# Patient Record
Sex: Female | Born: 1945 | Race: Black or African American | Hispanic: No | Marital: Single | Smoking: Former smoker
Health system: Southern US, Community
[De-identification: ages and names within clinical notes are randomized; demographics above are authoritative.]

## PROBLEM LIST (undated history)

## (undated) DIAGNOSIS — E669 Obesity, unspecified: Secondary | ICD-10-CM

## (undated) DIAGNOSIS — I700 Atherosclerosis of aorta: Secondary | ICD-10-CM

## (undated) DIAGNOSIS — R42 Dizziness and giddiness: Secondary | ICD-10-CM

## (undated) DIAGNOSIS — J302 Other seasonal allergic rhinitis: Secondary | ICD-10-CM

## (undated) DIAGNOSIS — IMO0001 Reserved for inherently not codable concepts without codable children: Secondary | ICD-10-CM

## (undated) DIAGNOSIS — R52 Pain, unspecified: Secondary | ICD-10-CM

## (undated) DIAGNOSIS — I10 Essential (primary) hypertension: Secondary | ICD-10-CM

## (undated) DIAGNOSIS — J189 Pneumonia, unspecified organism: Secondary | ICD-10-CM

## (undated) DIAGNOSIS — R768 Other specified abnormal immunological findings in serum: Secondary | ICD-10-CM

## (undated) DIAGNOSIS — E7800 Pure hypercholesterolemia, unspecified: Secondary | ICD-10-CM

## (undated) DIAGNOSIS — G4730 Sleep apnea, unspecified: Secondary | ICD-10-CM

## (undated) DIAGNOSIS — J40 Bronchitis, not specified as acute or chronic: Secondary | ICD-10-CM

## (undated) DIAGNOSIS — I209 Angina pectoris, unspecified: Secondary | ICD-10-CM

## (undated) DIAGNOSIS — J3081 Allergic rhinitis due to animal (cat) (dog) hair and dander: Secondary | ICD-10-CM

## (undated) DIAGNOSIS — M1990 Unspecified osteoarthritis, unspecified site: Secondary | ICD-10-CM

## (undated) HISTORY — PX: CARPAL TUNNEL RELEASE: SHX101

## (undated) HISTORY — PX: ABDOMINAL HYSTERECTOMY: SHX81

## (undated) HISTORY — PX: EYE SURGERY: SHX253

---

## 2014-04-08 ENCOUNTER — Ambulatory Visit: Payer: Self-pay | Admitting: Family Medicine

## 2015-01-21 ENCOUNTER — Emergency Department
Admission: EM | Admit: 2015-01-21 | Discharge: 2015-01-21 | Disposition: A | Discharge status: Home / Self Care | Payer: Medicare Other | Attending: Emergency Medicine | Admitting: Emergency Medicine

## 2015-01-21 ENCOUNTER — Encounter: Payer: Self-pay | Admitting: Emergency Medicine

## 2015-01-21 DIAGNOSIS — M2550 Pain in unspecified joint: Secondary | ICD-10-CM | POA: Insufficient documentation

## 2015-01-21 DIAGNOSIS — I10 Essential (primary) hypertension: Secondary | ICD-10-CM | POA: Insufficient documentation

## 2015-01-21 DIAGNOSIS — R7989 Other specified abnormal findings of blood chemistry: Secondary | ICD-10-CM | POA: Insufficient documentation

## 2015-01-21 DIAGNOSIS — M1990 Unspecified osteoarthritis, unspecified site: Secondary | ICD-10-CM | POA: Diagnosis not present

## 2015-01-21 DIAGNOSIS — Z87891 Personal history of nicotine dependence: Secondary | ICD-10-CM | POA: Insufficient documentation

## 2015-01-21 DIAGNOSIS — R748 Abnormal levels of other serum enzymes: Secondary | ICD-10-CM

## 2015-01-21 HISTORY — DX: Essential (primary) hypertension: I10

## 2015-01-21 HISTORY — DX: Allergic rhinitis due to animal (cat) (dog) hair and dander: J30.81

## 2015-01-21 HISTORY — DX: Unspecified osteoarthritis, unspecified site: M19.90

## 2015-01-21 LAB — CBC WITH DIFFERENTIAL/PLATELET
BASOS ABS: 0 10*3/uL (ref 0–0.1)
BASOS PCT: 1 %
EOS PCT: 2 %
Eosinophils Absolute: 0.1 10*3/uL (ref 0–0.7)
HEMATOCRIT: 37.8 % (ref 35.0–47.0)
Hemoglobin: 12.4 g/dL (ref 12.0–16.0)
LYMPHS PCT: 37 %
Lymphs Abs: 2.1 10*3/uL (ref 1.0–3.6)
MCH: 24.9 pg — ABNORMAL LOW (ref 26.0–34.0)
MCHC: 32.8 g/dL (ref 32.0–36.0)
MCV: 76.1 fL — AB (ref 80.0–100.0)
MONO ABS: 0.3 10*3/uL (ref 0.2–0.9)
MONOS PCT: 5 %
NEUTROS ABS: 3.1 10*3/uL (ref 1.4–6.5)
Neutrophils Relative %: 55 %
PLATELETS: 327 10*3/uL (ref 150–440)
RBC: 4.96 MIL/uL (ref 3.80–5.20)
RDW: 15.8 % — AB (ref 11.5–14.5)
WBC: 5.6 10*3/uL (ref 3.6–11.0)

## 2015-01-21 LAB — BASIC METABOLIC PANEL
Anion gap: 6 (ref 5–15)
BUN: 15 mg/dL (ref 6–20)
CALCIUM: 9.2 mg/dL (ref 8.9–10.3)
CO2: 29 mmol/L (ref 22–32)
Chloride: 105 mmol/L (ref 101–111)
Creatinine, Ser: 0.84 mg/dL (ref 0.44–1.00)
GFR calc Af Amer: >60 mL/min (ref >60)
GLUCOSE: 92 mg/dL (ref 65–99)
Potassium: 3.7 mmol/L (ref 3.5–5.1)
Sodium: 140 mmol/L (ref 135–145)

## 2015-01-21 LAB — CK: CK TOTAL: 349 U/L — AB (ref 38–234)

## 2015-01-21 NOTE — ED Provider Notes (Addendum)
[facility] Emergency Department Provider Note  ____________________________________________   I have reviewed the triage vital signs and the nursing notes.   HISTORY  Chief Complaint Joint Pain    HPI Anne Sharp is a 69 y.o. female patient with a history of arthritis states she had some joint pain after taking some supplements 6 weeks ago. She is not having any pain today but she read on the Internet that she might have rhabdomyolysis and she would like to be checked out. Patient states it was all the joints in her entire body. She denies any fever or chills or nausea or vomiting chest pain shortness of breath abdominal pain decreased exercise and she states that since she stopped taking some supplements, the name of which she cannot recall, the symptoms of a gradually improving her last several weeks. She states she read some things on the Internet which caused her to be concerned and she would like some blood work checked.  Past Medical History  Diagnosis Date  . Arthritis   . Hypertension   . Cat allergies     There are no active problems to display for this patient.   Past Surgical History  Procedure Laterality Date  . Abdominal hysterectomy      No current outpatient prescriptions on file.  Allergies Review of patient's allergies indicates no known allergies.  No family history on file.  Social History Social History  Substance Use Topics  . Smoking status: Former Games developer  . Smokeless tobacco: None  . Alcohol Use: No    Review of Systems Constitutional: No fever/chills Eyes: No visual changes. ENT: No sore throat. No stiff neck no neck pain Cardiovascular: Denies chest pain. Respiratory: Denies shortness of breath. Gastrointestinal:   no vomiting.  No diarrhea.  No constipation. Genitourinary: Negative for dysuria. Musculoskeletal: Negative lower extremity swelling Skin: Negative for rash. Neurological: Negative for headaches,  focal weakness or numbness. 10-point ROS otherwise negative.  ____________________________________________   PHYSICAL EXAM:  VITAL SIGNS: ED Triage Vitals  Enc Vitals Group     BP 01/21/15 1538 169/84 mmHg     Pulse Rate 01/21/15 1538 88     Resp 01/21/15 1538 18     Temp 01/21/15 1538 98.1 F (36.7 C)     Temp Source 01/21/15 1538 Oral     SpO2 01/21/15 1538 99 %     Weight 01/21/15 1538 220 lb (99.791 kg)     Height 01/21/15 1538  (1.626 m)     Head Cir --      Peak Flow --      Pain Score 01/21/15 1538 4     Pain Loc --      Pain Edu? --      Excl. in GC? --     Constitutional: Alert and oriented. Well appearing and in no acute distress. Eyes: Conjunctivae are normal. PERRL. EOMI. Head: Atraumatic. Nose: No congestion/rhinnorhea. Mouth/Throat: Mucous membranes are moist.  Oropharynx non-erythematous. Neck: No stridor.   Nontender with no meningismus Cardiovascular: Normal rate, regular rhythm. Grossly normal heart sounds.  Good peripheral circulation. Respiratory: Normal respiratory effort.  No retractions. Lungs CTAB. Abdominal: Soft and nontender. No distention. No guarding no rebound Back:  There is no focal tenderness or step off there is no midline tenderness there are no lesions noted. there is no CVA tenderness Musculoskeletal: No lower extremity tenderness. No joint effusions, no DVT signs strong distal pulses no edema Neurologic:  Normal speech and language. No gross  focal neurologic deficits are appreciated.  Skin:  Skin is warm, dry and intact. No rash noted. Psychiatric: Mood and affect are normal. Speech and behavior are normal.  ____________________________________________   LABS (all labs ordered are listed, but only abnormal results are displayed)  Labs Reviewed  CBC WITH DIFFERENTIAL/PLATELET - Abnormal; Notable for the following:    MCV 76.1 (*)    MCH 24.9 (*)    RDW 15.8 (*)    All other components within normal limits  CK  BASIC  METABOLIC PANEL   ____________________________________________  EKG  I personally interpreted any EKGs ordered by me or triage  ____________________________________________  RADIOLOGY  I reviewed any imaging ordered by me or triage that were performed during my shift ____________________________________________   PROCEDURES  Procedure(s) performed: None  Critical Care performed: None  ____________________________________________   INITIAL IMPRESSION / ASSESSMENT AND PLAN / ED COURSE  Pertinent labs & imaging results that were available during my care of the patient were reviewed by me and considered in my medical decision making (see chart for details).  Very well-appearing woman who has had a diffuse arthralgias which is now improving. She did go on a cruise. She may had then year some other infection although that is not likely given that there was no fever or other systemic illness. Her symptoms are gone at this time she has no complaints of pain.   ----------------------------------------- 7:54 PM on 01/21/2015 -----------------------------------------  Patient's total CK is very slightly elevated is unclear why this is. She has no evidence of compartment syndrome or any other significant pathology at this time spent 6 weeks of symptoms, patient's renal function is completely normal. We will refer her to her primary care doctor as an outpatient for further evaluation. Also refer her to rheumatology  ----------------------------------------- 8:13 PM on 01/21/2015 -----------------------------------------  The patient was very anxious after being told that her total CK was slightly elevated. I have tried to allay her fears but stressed follow-up at the same time. Patient is eager to go home to take care of her cat. She has no complaints or symptoms at this time. Her blood pressure is mildly elevated. I have indicated that she must go home and follow up closely with her  doctor for a recheck and she understands. She has no chest pain or headache or shortness of breath. Patient's systolic is coming down and I suspect this is an artifactual reading but the patient does not wish to stay for further blood pressure checks because she is ready to go. ______________________   FINAL CLINICAL IMPRESSION(S) / ED DIAGNOSES  Final diagnoses:  None     Jeanmarie PlantJames A Jaelynn Currier, MD 01/21/15 1936  Jeanmarie PlantJames A Tabby Beaston, MD 01/21/15 1955  Jeanmarie PlantJames A Rigo Letts, MD 01/21/15 2014

## 2015-01-21 NOTE — Discharge Instructions (Signed)
Drink plenty of fluids, at this time there is no significant evidence of muscle breakdown although your CK is very slightly elevated. This finding is of uncertain importance. If you have increased pain, fever, chills, muscle pain that gets worse, or you feel worse in any way return to the emergency room. Follow closely with your primary care doctor in a few days as well as rheumatology.

## 2015-01-21 NOTE — ED Notes (Addendum)
Pt states she started taking supplements again about 2 weeks ago and the muscle burning pain returned severely.  States she needs to use pain meds at night for relief.

## 2015-01-21 NOTE — ED Notes (Signed)
Pt presents with all over joint pain and burning for six weeks. Pt did start on a diet supplement about six weeks ago then started having sx. She then stopped supplements for two weeks and sx decreased but are still present and has had to take pain medicine to get relief. Pt with hx of arthritis.

## 2015-01-21 NOTE — ED Notes (Signed)

## 2015-05-27 ENCOUNTER — Encounter: Payer: Self-pay | Admitting: *Deleted

## 2015-05-30 NOTE — H&P (Signed)
See scanned note.

## 2015-06-02 ENCOUNTER — Encounter
Admission: RE | Disposition: A | Discharge status: Home / Self Care | Payer: Self-pay | Source: Ambulatory Visit | Attending: Ophthalmology

## 2015-06-02 ENCOUNTER — Ambulatory Visit: Payer: Medicare Other | Admitting: Certified Registered Nurse Anesthetist

## 2015-06-02 ENCOUNTER — Encounter: Payer: Self-pay | Admitting: *Deleted

## 2015-06-02 ENCOUNTER — Ambulatory Visit
Admission: RE | Admit: 2015-06-02 | Discharge: 2015-06-02 | Disposition: A | Discharge status: Home / Self Care | Payer: Medicare Other | Source: Ambulatory Visit | Attending: Ophthalmology | Admitting: Ophthalmology

## 2015-06-02 DIAGNOSIS — M1990 Unspecified osteoarthritis, unspecified site: Secondary | ICD-10-CM | POA: Insufficient documentation

## 2015-06-02 DIAGNOSIS — E669 Obesity, unspecified: Secondary | ICD-10-CM | POA: Insufficient documentation

## 2015-06-02 DIAGNOSIS — H269 Unspecified cataract: Secondary | ICD-10-CM | POA: Diagnosis not present

## 2015-06-02 DIAGNOSIS — H2512 Age-related nuclear cataract, left eye: Secondary | ICD-10-CM | POA: Insufficient documentation

## 2015-06-02 DIAGNOSIS — I10 Essential (primary) hypertension: Secondary | ICD-10-CM | POA: Insufficient documentation

## 2015-06-02 DIAGNOSIS — Z87891 Personal history of nicotine dependence: Secondary | ICD-10-CM | POA: Diagnosis not present

## 2015-06-02 HISTORY — DX: Dizziness and giddiness: R42

## 2015-06-02 HISTORY — DX: Bronchitis, not specified as acute or chronic: J40

## 2015-06-02 HISTORY — PX: CATARACT EXTRACTION W/PHACO: SHX586

## 2015-06-02 HISTORY — DX: Pneumonia, unspecified organism: J18.9

## 2015-06-02 HISTORY — DX: Reserved for inherently not codable concepts without codable children: IMO0001

## 2015-06-02 HISTORY — DX: Angina pectoris, unspecified: I20.9

## 2015-06-02 SURGERY — PHACOEMULSIFICATION, CATARACT, WITH IOL INSERTION
Anesthesia: Monitor Anesthesia Care | Site: Eye | Laterality: Left | Wound class: Clean

## 2015-06-02 MED ORDER — BSS IO SOLN
INTRAOCULAR | Status: DC | PRN
  Administered 2015-06-02: 1 mL via OPHTHALMIC

## 2015-06-02 MED ORDER — SODIUM CHLORIDE 0.9 % IV SOLN
INTRAVENOUS | Status: DC
  Administered 2015-06-02: 08:00:00 via INTRAVENOUS

## 2015-06-02 MED ORDER — MOXIFLOXACIN HCL 0.5 % OP SOLN
1.0000 [drp] | OPHTHALMIC | Status: AC
  Administered 2015-06-02 (×3): 1 [drp] via OPHTHALMIC

## 2015-06-02 MED ORDER — MIDAZOLAM HCL 2 MG/2ML IJ SOLN
INTRAMUSCULAR | Status: DC | PRN
  Administered 2015-06-02 (×3): 1 mg via INTRAVENOUS

## 2015-06-02 MED ORDER — LIDOCAINE HCL (PF) 4 % IJ SOLN
INTRAMUSCULAR | Status: DC | PRN
  Administered 2015-06-02: 4 mL via OPHTHALMIC

## 2015-06-02 MED ORDER — POVIDONE-IODINE 5 % OP SOLN
OPHTHALMIC | Status: AC
  Filled 2015-06-02: qty 30

## 2015-06-02 MED ORDER — CEFUROXIME OPHTHALMIC INJECTION 1 MG/0.1 ML
INJECTION | OPHTHALMIC | Status: DC | PRN
  Administered 2015-06-02: .1 mL via INTRACAMERAL

## 2015-06-02 MED ORDER — MOXIFLOXACIN HCL 0.5 % OP SOLN
OPHTHALMIC | Status: DC | PRN
  Administered 2015-06-02: 1 [drp] via OPHTHALMIC

## 2015-06-02 MED ORDER — TETRACAINE HCL 0.5 % OP SOLN
OPHTHALMIC | Status: AC
  Filled 2015-06-02: qty 2

## 2015-06-02 MED ORDER — CEFUROXIME OPHTHALMIC INJECTION 1 MG/0.1 ML
INJECTION | OPHTHALMIC | Status: AC
  Filled 2015-06-02: qty 0.1

## 2015-06-02 MED ORDER — CYCLOPENTOLATE HCL 2 % OP SOLN
1.0000 [drp] | OPHTHALMIC | Status: AC
  Administered 2015-06-02 (×4): 1 [drp] via OPHTHALMIC

## 2015-06-02 MED ORDER — LIDOCAINE HCL (PF) 4 % IJ SOLN
INTRAOCULAR | Status: DC | PRN
  Administered 2015-06-02: .5 mL via OPHTHALMIC

## 2015-06-02 MED ORDER — BUPIVACAINE HCL (PF) 0.75 % IJ SOLN
INTRAMUSCULAR | Status: AC
  Filled 2015-06-02: qty 10

## 2015-06-02 MED ORDER — POVIDONE-IODINE 5 % OP SOLN
OPHTHALMIC | Status: DC | PRN
  Administered 2015-06-02: 1 via OPHTHALMIC

## 2015-06-02 MED ORDER — NA CHONDROIT SULF-NA HYALURON 40-17 MG/ML IO SOLN
INTRAOCULAR | Status: AC
  Filled 2015-06-02: qty 1

## 2015-06-02 MED ORDER — PHENYLEPHRINE HCL 10 % OP SOLN
1.0000 [drp] | OPHTHALMIC | Status: AC
  Administered 2015-06-02 (×4): 1 [drp] via OPHTHALMIC

## 2015-06-02 MED ORDER — CARBACHOL 0.01 % IO SOLN
INTRAOCULAR | Status: DC | PRN
  Administered 2015-06-02: .5 mL via INTRAOCULAR

## 2015-06-02 MED ORDER — TETRACAINE HCL 0.5 % OP SOLN
OPHTHALMIC | Status: DC | PRN
  Administered 2015-06-02: 1 [drp] via OPHTHALMIC

## 2015-06-02 MED ORDER — ALFENTANIL 500 MCG/ML IJ INJ
INJECTION | INTRAMUSCULAR | Status: DC | PRN
  Administered 2015-06-02: 100 ug via INTRAVENOUS
  Administered 2015-06-02: 300 ug via INTRAVENOUS
  Administered 2015-06-02 (×3): 200 ug via INTRAVENOUS

## 2015-06-02 MED ORDER — NA CHONDROIT SULF-NA HYALURON 40-17 MG/ML IO SOLN
INTRAOCULAR | Status: DC | PRN
  Administered 2015-06-02: 1 mL via INTRAOCULAR

## 2015-06-02 MED ORDER — EPINEPHRINE HCL 1 MG/ML IJ SOLN
INTRAMUSCULAR | Status: AC
  Filled 2015-06-02: qty 2

## 2015-06-02 MED ORDER — LIDOCAINE HCL (PF) 4 % IJ SOLN
INTRAMUSCULAR | Status: AC
  Filled 2015-06-02: qty 5

## 2015-06-02 MED ORDER — HYALURONIDASE HUMAN 150 UNIT/ML IJ SOLN
INTRAMUSCULAR | Status: AC
  Filled 2015-06-02: qty 1

## 2015-06-02 SURGICAL SUPPLY — 30 items
CANNULA ANT/CHMB 27GA (MISCELLANEOUS) ×3 IMPLANT
CORD BIP STRL DISP 12FT (MISCELLANEOUS) ×3 IMPLANT
CUP MEDICINE 2OZ PLAST GRAD ST (MISCELLANEOUS) ×3 IMPLANT
DRAPE XRAY CASSETTE 23X24 (DRAPES) ×3 IMPLANT
ERASER HMR WETFIELD 18G (MISCELLANEOUS) ×3 IMPLANT
GLOVE BIO SURGEON STRL SZ8 (GLOVE) ×3 IMPLANT
GLOVE SURG LX 6.5 MICRO (GLOVE) ×2
GLOVE SURG LX 8.0 MICRO (GLOVE) ×2
GLOVE SURG LX STRL 6.5 MICRO (GLOVE) ×1 IMPLANT
GLOVE SURG LX STRL 8.0 MICRO (GLOVE) ×1 IMPLANT
GOWN STRL REUS W/ TWL LRG LVL3 (GOWN DISPOSABLE) ×1 IMPLANT
GOWN STRL REUS W/ TWL XL LVL3 (GOWN DISPOSABLE) ×1 IMPLANT
GOWN STRL REUS W/TWL LRG LVL3 (GOWN DISPOSABLE) ×2
GOWN STRL REUS W/TWL XL LVL3 (GOWN DISPOSABLE) ×2
LENS IOL ACRSF IQ ULTRA 18.5 (Intraocular Lens) ×1 IMPLANT
LENS IOL ACRYSOF IQ 18.5 (Intraocular Lens) ×3 IMPLANT
PACK CATARACT (MISCELLANEOUS) ×3 IMPLANT
PACK CATARACT DINGLEDEIN LX (MISCELLANEOUS) ×3 IMPLANT
PACK EYE AFTER SURG (MISCELLANEOUS) ×3 IMPLANT
SHLD EYE VISITEC  UNIV (MISCELLANEOUS) ×3 IMPLANT
SOL BSS BAG (MISCELLANEOUS) ×3
SOL PREP PVP 2OZ (MISCELLANEOUS) ×3
SOLUTION BSS BAG (MISCELLANEOUS) ×1 IMPLANT
SOLUTION PREP PVP 2OZ (MISCELLANEOUS) ×1 IMPLANT
SUT SILK 5-0 (SUTURE) ×3 IMPLANT
SYR 3ML LL SCALE MARK (SYRINGE) ×3 IMPLANT
SYR 5ML LL (SYRINGE) ×3 IMPLANT
SYR TB 1ML 27GX1/2 LL (SYRINGE) ×3 IMPLANT
WATER STERILE IRR 1000ML POUR (IV SOLUTION) ×3 IMPLANT
WIPE NON LINTING 3.25X3.25 (MISCELLANEOUS) ×3 IMPLANT

## 2015-06-02 NOTE — Transfer of Care (Signed)
Immediate Anesthesia Transfer of Care Note  Patient: Anne Sharp  Procedure(s) Performed: Procedure(s) with comments: CATARACT EXTRACTION PHACO AND INTRAOCULAR LENS PLACEMENT (IOC) (Left) - US 01:21 AP% 22.9 CDE 32.25 fluid pack lot # [id number]  Patient Location: PACU  Anesthesia Type:MAC  Level of Consciousness: awake, alert  and oriented  Airway & Oxygen Therapy: Pt spontaneously breathing  Post-op Assessment: Report given to RN and Post -op Vital signs reviewed and stable  Post vital signs: Reviewed and stable  Last Vitals:  Filed Vitals:   05/27/15 1447 06/02/15 0715  BP: 157/98 127/83  Pulse: 97 84  Temp:  36.1 C  Resp:  18    Last Pain:  Filed Vitals:   06/02/15 0721  PainSc: 0-No pain         Complications: No apparent anesthesia complications

## 2015-06-02 NOTE — Anesthesia Procedure Notes (Signed)
Performed by: Ahleah Simko Pre-anesthesia Checklist: Patient identified, Emergency Drugs available, Suction available, Patient being monitored and Timeout performed Oxygen Delivery Method: Nasal cannula       

## 2015-06-02 NOTE — Discharge Instructions (Addendum)
See handout. Eye Surgery Discharge Instructions  Expect mild scratchy sensation or mild soreness. DO NOT RUB YOUR EYE!  The day of surgery:  Minimal physical activity, but bed rest is not required  No reading, computer work, or close hand work  No bending, lifting, or straining.  May watch TV  For 24 hours:  No driving, legal decisions, or alcoholic beverages  Safety precautions  Eat anything you prefer: It is better to start with liquids, then soup then solid foods.  _____ Eye patch should be worn until postoperative exam tomorrow.  ____ Solar shield eyeglasses should be worn for comfort in the sunlight/patch while sleeping  Resume all regular medications including aspirin or Coumadin if these were discontinued prior to surgery. You may shower, bathe, shave, or wash your hair. Tylenol may be taken for mild discomfort.  Call your doctor if you experience significant pain, nausea, or vomiting, fever > 101 or other signs of infection. [phone] or [phone] Specific instructions:  Follow-up Information    Follow up with Sallee LangeINGELDEIN,STEVEN, MD.   Specialty:  Ophthalmology   Why:  06-03-15 at 9:40   Contact information:   [address]   [place] [address] [phone]      Eye Surgery Discharge Instructions  Expect mild scratchy sensation or mild soreness. DO NOT RUB YOUR EYE!  The day of surgery:  Minimal physical activity, but bed rest is not required  No reading, computer work, or close hand work  No bending, lifting, or straining.  May watch TV  For 24 hours:  No driving, legal decisions, or alcoholic beverages  Safety precautions  Eat anything you prefer: It is better to start with liquids, then soup then solid foods.  _____ Eye patch should be worn until postoperative exam tomorrow.  ____ Solar shield eyeglasses should be worn for comfort in the sunlight/patch while sleeping  Resume all regular medications including aspirin or  Coumadin if these were discontinued prior to surgery. You may shower, bathe, shave, or wash your hair. Tylenol may be taken for mild discomfort.  Call your doctor if you experience significant pain, nausea, or vomiting, fever > 101 or other signs of infection. [phone] or [phone] Specific instructions:  Follow-up Information    Follow up with Sallee LangeINGELDEIN,STEVEN, MD.   Specialty:  Ophthalmology   Why:  06-03-15 at 9:40   Contact information:   [address]   [place] [address] [phone]

## 2015-06-02 NOTE — Interval H&P Note (Signed)
History and Physical Interval Note:  06/02/2015 7:25 AM  [name]  has presented today for surgery, with the diagnosis of CATARACT  The various methods of treatment have been discussed with the patient and family. After consideration of risks, benefits and other options for treatment, the patient has consented to  Procedure(s): CATARACT EXTRACTION PHACO AND INTRAOCULAR LENS PLACEMENT (IOC) (Left) as a surgical intervention .  The patient's history has been reviewed, patient examined, no change in status, stable for surgery.  I have reviewed the patient's chart and labs.  Questions were answered to the patient's satisfaction.     Susen Haskew

## 2015-06-02 NOTE — Op Note (Signed)
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 Item Serial No. Manufacturer Lot No. LRB No. Used  LENS IOL ACRYSOF IQ 18.5 - [id number] Intraocular Lens LENS IOL ACRYSOF IQ 18.5 [id number] ALCON   Left 1   Anesthesia: 2% Lidocaine and 4% Marcaine in a 50/50 mixture with 10 unites/ml of Hylenex given as a peribulbar Anesthesiologist: Anesthesiologist: Berdine AddisonMathai Thomas, MD CRNA: Malva Coganatherine Beane, CRNA; Ginger CarneStephanie Michelet, CRNA Complications: none Estimated Blood Loss: less than 1 ml  Description of procedure:  The patient was given anesthesia and sedation via intravenous access. The patient was then prepped and draped in the usual fashion. A 25-gauge needle was bent for initiating the capsulorhexis. A 5-0 silk suture was placed through the conjunctiva superior and inferiorly to serve as bridle sutures. Hemostasis was obtained at the superior limbus using an eraser cautery. A partial thickness groove was made at the anterior surgical limbus with a 64 Beaver blade and this was dissected anteriorly with an SYSCOlcon Crescent knife. The anterior chamber was entered at 10 o'clock with a 1.0 mm paracentesis knife and through the lamellar dissection with a 2.6 mm Alcon keratome. Epi-Shugarcaine 0.5 CC [9 cc BSS Plus (Alcon), 3 cc 4% preservative-free lidocaine (Hospira) and 4 cc 1:1000 preservative-free, bisulfite-free epinephrine] was injected into the anterior chamber via the paracentesis tract. Epi-Shugarcaine 0.5 CC [9 cc BSS Plus (Alcon), 3 cc 4% preservative-free lidocaine (Hospira) and 4 cc 1:1000 preservative-free, bisulfite-free epinephrine] was injected into the anterior chamber via the paracentesis tract. DiscoVisc  was injected to replace the aqueous and a continuous tear curvilinear capsulorhexis was performed using a bent 25-gauge needle.  Balance salt on a syringe was used to perform hydro-dissection and phacoemulsification was carried out using a divide and conquer technique. Procedure(s) with comments: CATARACT EXTRACTION PHACO AND INTRAOCULAR LENS PLACEMENT (IOC) (Left) - US 01:21 AP% 22.9 CDE 32.25 fluid pack lot # [id number]. Irrigation/aspiration was used to remove the residual cortex and the capsular bag was inflated with DiscoVisc. The intraocular lens was inserted into the capsular bag using a pre-loaded UltraSert Delivery System. Irrigation/aspiration was used to remove the residual DiscoVisc. The wound was inflated with balanced salt and checked for leaks. None were found. Miostat was injected via the paracentesis track and 0.1 ml of cefuroxime containing 1 mg of drug  was injected via the paracentesis track. The wound was checked for leaks again and none were found.   The bridal sutures were removed and two drops of Vigamox were placed on the eye. An eye shield was placed to protect the eye and the patient was discharged to the recovery area in good condition.   Lashay Osborne MD

## 2015-06-02 NOTE — Anesthesia Postprocedure Evaluation (Signed)
Anesthesia Post Note  Patient: Anne Sharp  Procedure(s) Performed: Procedure(s) (LRB): CATARACT EXTRACTION PHACO AND INTRAOCULAR LENS PLACEMENT (IOC) (Left)  Patient location during evaluation: PACU Anesthesia Type: MAC Level of consciousness: awake and alert and oriented Pain management: satisfactory to patient Vital Signs Assessment: post-procedure vital signs reviewed and stable Respiratory status: respiratory function stable Cardiovascular status: stable    Last Vitals:  Filed Vitals:   05/27/15 1447 06/02/15 0715  BP: 157/98 127/83  Pulse: 97 84  Temp:  36.1 C  Resp:  18    Last Pain:  Filed Vitals:   06/02/15 0721  PainSc: 0-No pain                 Clydene PughBeane, Briauna Gilmartin D

## 2015-06-02 NOTE — Anesthesia Preprocedure Evaluation (Addendum)
Anesthesia Evaluation  Patient identified by MRN, date of birth, ID band Patient awake    Reviewed: Allergy & Precautions, NPO status , Patient's Chart, lab work & pertinent test results, reviewed documented beta blocker date and time   Airway Mallampati: III  TM Distance: >3 FB     Dental  (+) Chipped   Pulmonary shortness of breath, pneumonia, resolved, former smoker,           Cardiovascular hypertension, Pt. on medications + angina      Neuro/Psych    GI/Hepatic   Endo/Other    Renal/GU      Musculoskeletal  (+) Arthritis ,   Abdominal   Peds  Hematology   Anesthesia Other Findings Obese.EKG and ECHO OK. Temporary tooth.  Reproductive/Obstetrics                            Anesthesia Physical Anesthesia Plan  ASA: III  Anesthesia Plan: MAC   Post-op Pain Management:    Induction:   Airway Management Planned:   Additional Equipment:   Intra-op Plan:   Post-operative Plan:   Informed Consent: I have reviewed the patients History and Physical, chart, labs and discussed the procedure including the risks, benefits and alternatives for the proposed anesthesia with the patient or authorized representative who has indicated his/her understanding and acceptance.     Plan Discussed with: CRNA  Anesthesia Plan Comments:         Anesthesia Quick Evaluation

## 2015-07-24 ENCOUNTER — Encounter: Payer: Self-pay | Admitting: *Deleted

## 2015-07-29 NOTE — H&P (Signed)
See scanned note.

## 2015-07-30 ENCOUNTER — Encounter: Payer: Self-pay | Admitting: *Deleted

## 2015-07-30 ENCOUNTER — Ambulatory Visit: Payer: Medicare Other | Admitting: Anesthesiology

## 2015-07-30 ENCOUNTER — Encounter
Admission: RE | Disposition: A | Discharge status: Home / Self Care | Payer: Self-pay | Source: Ambulatory Visit | Attending: Ophthalmology

## 2015-07-30 ENCOUNTER — Ambulatory Visit
Admission: RE | Admit: 2015-07-30 | Discharge: 2015-07-30 | Disposition: A | Discharge status: Home / Self Care | Payer: Medicare Other | Source: Ambulatory Visit | Attending: Ophthalmology | Admitting: Ophthalmology

## 2015-07-30 DIAGNOSIS — Z87891 Personal history of nicotine dependence: Secondary | ICD-10-CM | POA: Insufficient documentation

## 2015-07-30 DIAGNOSIS — M1990 Unspecified osteoarthritis, unspecified site: Secondary | ICD-10-CM | POA: Diagnosis not present

## 2015-07-30 DIAGNOSIS — E669 Obesity, unspecified: Secondary | ICD-10-CM | POA: Insufficient documentation

## 2015-07-30 DIAGNOSIS — I10 Essential (primary) hypertension: Secondary | ICD-10-CM | POA: Diagnosis not present

## 2015-07-30 DIAGNOSIS — H2511 Age-related nuclear cataract, right eye: Secondary | ICD-10-CM | POA: Diagnosis not present

## 2015-07-30 DIAGNOSIS — H25011 Cortical age-related cataract, right eye: Secondary | ICD-10-CM | POA: Insufficient documentation

## 2015-07-30 HISTORY — DX: Pain, unspecified: R52

## 2015-07-30 HISTORY — PX: CATARACT EXTRACTION W/PHACO: SHX586

## 2015-07-30 HISTORY — DX: Other seasonal allergic rhinitis: J30.2

## 2015-07-30 SURGERY — PHACOEMULSIFICATION, CATARACT, WITH IOL INSERTION
Anesthesia: Monitor Anesthesia Care | Site: Eye | Laterality: Right | Wound class: Clean

## 2015-07-30 MED ORDER — CYCLOPENTOLATE HCL 2 % OP SOLN
OPHTHALMIC | Status: AC
  Administered 2015-07-30: 1 [drp] via OPHTHALMIC
  Filled 2015-07-30: qty 2

## 2015-07-30 MED ORDER — NA CHONDROIT SULF-NA HYALURON 40-17 MG/ML IO SOLN
INTRAOCULAR | Status: DC | PRN
  Administered 2015-07-30: 1 mL via INTRAOCULAR

## 2015-07-30 MED ORDER — EPINEPHRINE HCL 1 MG/ML IJ SOLN
INTRAOCULAR | Status: DC | PRN
  Administered 2015-07-30: 1 mL via OPHTHALMIC

## 2015-07-30 MED ORDER — CARBACHOL 0.01 % IO SOLN
INTRAOCULAR | Status: DC | PRN
  Administered 2015-07-30: 0.5 mL via INTRAOCULAR

## 2015-07-30 MED ORDER — BSS IO SOLN
INTRAOCULAR | Status: DC | PRN
  Administered 2015-07-30: .5 mL via OPHTHALMIC

## 2015-07-30 MED ORDER — EPINEPHRINE HCL 1 MG/ML IJ SOLN
INTRAMUSCULAR | Status: AC
  Filled 2015-07-30: qty 2

## 2015-07-30 MED ORDER — SODIUM CHLORIDE 0.9 % IV SOLN
INTRAVENOUS | Status: DC
  Administered 2015-07-30: 07:00:00 via INTRAVENOUS

## 2015-07-30 MED ORDER — LIDOCAINE HCL (PF) 4 % IJ SOLN
INTRAMUSCULAR | Status: AC
  Filled 2015-07-30: qty 5

## 2015-07-30 MED ORDER — TETRACAINE HCL 0.5 % OP SOLN
OPHTHALMIC | Status: AC
  Filled 2015-07-30: qty 2

## 2015-07-30 MED ORDER — CEFUROXIME OPHTHALMIC INJECTION 1 MG/0.1 ML
INJECTION | OPHTHALMIC | Status: DC | PRN
  Administered 2015-07-30: 0.1 mL via INTRACAMERAL

## 2015-07-30 MED ORDER — POVIDONE-IODINE 5 % OP SOLN
OPHTHALMIC | Status: AC
  Filled 2015-07-30: qty 30

## 2015-07-30 MED ORDER — PHENYLEPHRINE HCL 10 % OP SOLN
1.0000 [drp] | OPHTHALMIC | Status: AC | PRN
  Administered 2015-07-30 (×4): 1 [drp] via OPHTHALMIC

## 2015-07-30 MED ORDER — PHENYLEPHRINE HCL 10 % OP SOLN
OPHTHALMIC | Status: AC
  Administered 2015-07-30: 1 [drp] via OPHTHALMIC
  Filled 2015-07-30: qty 5

## 2015-07-30 MED ORDER — TETRACAINE HCL 0.5 % OP SOLN
OPHTHALMIC | Status: DC | PRN
  Administered 2015-07-30: 1 [drp] via OPHTHALMIC

## 2015-07-30 MED ORDER — LIDOCAINE HCL (PF) 4 % IJ SOLN
INTRAMUSCULAR | Status: DC | PRN
  Administered 2015-07-30: 4 mL via OPHTHALMIC

## 2015-07-30 MED ORDER — MOXIFLOXACIN HCL 0.5 % OP SOLN
OPHTHALMIC | Status: AC
  Administered 2015-07-30: 1 [drp] via OPHTHALMIC
  Filled 2015-07-30: qty 3

## 2015-07-30 MED ORDER — NA CHONDROIT SULF-NA HYALURON 40-17 MG/ML IO SOLN
INTRAOCULAR | Status: AC
  Filled 2015-07-30: qty 1

## 2015-07-30 MED ORDER — POVIDONE-IODINE 5 % OP SOLN
OPHTHALMIC | Status: DC | PRN
  Administered 2015-07-30: 1 via OPHTHALMIC

## 2015-07-30 MED ORDER — MOXIFLOXACIN HCL 0.5 % OP SOLN
1.0000 [drp] | OPHTHALMIC | Status: AC | PRN
  Administered 2015-07-30 (×3): 1 [drp] via OPHTHALMIC

## 2015-07-30 MED ORDER — CEFUROXIME OPHTHALMIC INJECTION 1 MG/0.1 ML
INJECTION | OPHTHALMIC | Status: AC
  Filled 2015-07-30: qty 0.1

## 2015-07-30 MED ORDER — HYALURONIDASE HUMAN 150 UNIT/ML IJ SOLN
INTRAMUSCULAR | Status: AC
  Filled 2015-07-30: qty 1

## 2015-07-30 MED ORDER — MOXIFLOXACIN HCL 0.5 % OP SOLN
OPHTHALMIC | Status: DC | PRN
  Administered 2015-07-30: 1 [drp] via OPHTHALMIC

## 2015-07-30 MED ORDER — MIDAZOLAM HCL 5 MG/5ML IJ SOLN
INTRAMUSCULAR | Status: DC | PRN
  Administered 2015-07-30 (×4): 1 mg via INTRAVENOUS

## 2015-07-30 MED ORDER — CYCLOPENTOLATE HCL 2 % OP SOLN
1.0000 [drp] | OPHTHALMIC | Status: AC | PRN
  Administered 2015-07-30 (×4): 1 [drp] via OPHTHALMIC

## 2015-07-30 MED ORDER — ALFENTANIL 500 MCG/ML IJ INJ
INJECTION | INTRAMUSCULAR | Status: DC | PRN
  Administered 2015-07-30 (×2): 500 ug via INTRAVENOUS

## 2015-07-30 MED ORDER — BUPIVACAINE HCL (PF) 0.75 % IJ SOLN
INTRAMUSCULAR | Status: AC
  Filled 2015-07-30: qty 10

## 2015-07-30 SURGICAL SUPPLY — 30 items
CANNULA ANT/CHMB 27GA (MISCELLANEOUS) ×3 IMPLANT
CORD BIP STRL DISP 12FT (MISCELLANEOUS) ×3 IMPLANT
CUP MEDICINE 2OZ PLAST GRAD ST (MISCELLANEOUS) ×3 IMPLANT
DRAPE XRAY CASSETTE 23X24 (DRAPES) ×3 IMPLANT
ERASER HMR WETFIELD 18G (MISCELLANEOUS) ×3 IMPLANT
GLOVE BIO SURGEON STRL SZ8 (GLOVE) ×3 IMPLANT
GLOVE SURG LX 6.5 MICRO (GLOVE) ×2
GLOVE SURG LX 8.0 MICRO (GLOVE) ×2
GLOVE SURG LX STRL 6.5 MICRO (GLOVE) ×1 IMPLANT
GLOVE SURG LX STRL 8.0 MICRO (GLOVE) ×1 IMPLANT
GOWN STRL REUS W/ TWL LRG LVL3 (GOWN DISPOSABLE) ×1 IMPLANT
GOWN STRL REUS W/ TWL XL LVL3 (GOWN DISPOSABLE) ×1 IMPLANT
GOWN STRL REUS W/TWL LRG LVL3 (GOWN DISPOSABLE) ×2
GOWN STRL REUS W/TWL XL LVL3 (GOWN DISPOSABLE) ×2
LENS IOL ACRSF IQ ULTRA 18.5 (Intraocular Lens) ×1 IMPLANT
LENS IOL ACRYSOF IQ 18.5 (Intraocular Lens) ×3 IMPLANT
PACK CATARACT (MISCELLANEOUS) ×3 IMPLANT
PACK CATARACT DINGLEDEIN LX (MISCELLANEOUS) ×3 IMPLANT
PACK EYE AFTER SURG (MISCELLANEOUS) ×3 IMPLANT
SHLD EYE VISITEC  UNIV (MISCELLANEOUS) ×3 IMPLANT
SOL BSS BAG (MISCELLANEOUS) ×3
SOL PREP PVP 2OZ (MISCELLANEOUS) ×3
SOLUTION BSS BAG (MISCELLANEOUS) ×1 IMPLANT
SOLUTION PREP PVP 2OZ (MISCELLANEOUS) ×1 IMPLANT
SUT SILK 5-0 (SUTURE) ×3 IMPLANT
SYR 3ML LL SCALE MARK (SYRINGE) ×3 IMPLANT
SYR 5ML LL (SYRINGE) ×3 IMPLANT
SYR TB 1ML 27GX1/2 LL (SYRINGE) ×3 IMPLANT
WATER STERILE IRR 1000ML POUR (IV SOLUTION) ×3 IMPLANT
WIPE NON LINTING 3.25X3.25 (MISCELLANEOUS) ×3 IMPLANT

## 2015-07-30 NOTE — Anesthesia Postprocedure Evaluation (Signed)
Anesthesia Post Note  Patient: Anne Sharp  Procedure(s) Performed: Procedure(s) (LRB): CATARACT EXTRACTION PHACO AND INTRAOCULAR LENS PLACEMENT (IOC) (Right)  Patient location during evaluation: PACU Anesthesia Type: MAC Level of consciousness: awake and alert and oriented Pain management: satisfactory to patient Vital Signs Assessment: post-procedure vital signs reviewed and stable Respiratory status: respiratory function stable Cardiovascular status: stable Anesthetic complications: no    Last Vitals:  Filed Vitals:   07/30/15 0615 07/30/15 0816  BP: 134/87 140/89  Pulse: 84 80  Temp: 37.1 C 36.1 C  Resp: 20 18    Last Pain: There were no vitals filed for this visit.               Michaele OfferSavage,  Bronda Alfred A

## 2015-07-30 NOTE — Transfer of Care (Signed)
Immediate Anesthesia Transfer of Care Note  Patient: Anne Sharp  Procedure(s) Performed: Procedure(s) with comments: CATARACT EXTRACTION PHACO AND INTRAOCULAR LENS PLACEMENT (IOC) (Right) - US 01:03 AP% 23.7 CDE 27.14 fluid pack lot # [id number]  Patient Location: PACU  Anesthesia Type:MAC  Level of Consciousness: awake, alert , oriented and patient cooperative  Airway & Oxygen Therapy: Patient Spontanous Breathing  Post-op Assessment: Report given to RN, Post -op Vital signs reviewed and stable and Patient moving all extremities X 4  Post vital signs: Reviewed and stable  Last Vitals:  Filed Vitals:   07/30/15 0615  BP: 134/87  Pulse: 84  Temp: 37.1 C  Resp: 20    Last Pain: There were no vitals filed for this visit.       Complications: No apparent anesthesia complications

## 2015-07-30 NOTE — Op Note (Signed)
Date of Surgery: 07/30/2015 Date of Dictation: 07/30/2015 8:14 AM Pre-operative Diagnosis:  Nuclear Sclerotic Cataract and Cortical Cataract right Eye Post-operative Diagnosis: same Procedure performed: Extra-capsular Cataract Extraction (ECCE) with placement of a posterior chamber intraocular lens (IOL) right Eye IOL:  Implant Name Type Inv. Item Serial No. Manufacturer Lot No. LRB No. Used  LENS IOL ACRYSOF IQ 18.5 - [id number] Intraocular Lens LENS IOL ACRYSOF IQ 18.5 [id number] ALCON   Right 1   Anesthesia: 2% Lidocaine and 4% Marcaine in a 50/50 mixture with 10 unites/ml of Hylenex given as a peribulbar Anesthesiologist: Anesthesiologist: Yves DillPaul Carroll, MD CRNA: Michaele OfferKasey Savage, CRNA Complications: none Estimated Blood Loss: less than 1 ml  Description of procedure:  The patient was given anesthesia and sedation via intravenous access. The patient was then prepped and draped in the usual fashion. A 25-gauge needle was bent for initiating the capsulorhexis. A 5-0 silk suture was placed through the conjunctiva superior and inferiorly to serve as bridle sutures. Hemostasis was obtained at the superior limbus using an eraser cautery. A partial thickness groove was made at the anterior surgical limbus with a 64 Beaver blade and this was dissected anteriorly with an SYSCOlcon Crescent knife. The anterior chamber was entered at 10 o'clock with a 1.0 mm paracentesis knife and through the lamellar dissection with a 2.6 mm Alcon keratome. Epi-Shugarcaine 0.5 CC [9 cc BSS Plus (Alcon), 3 cc 4% preservative-free lidocaine (Hospira) and 4 cc 1:1000 preservative-free, bisulfite-free epinephrine] was injected into the anterior chamber via the paracentesis tract. Epi-Shugarcaine 0.5 CC [9 cc BSS Plus (Alcon), 3 cc 4% preservative-free lidocaine (Hospira) and 4 cc 1:1000 preservative-free, bisulfite-free epinephrine] was injected into the anterior chamber via the paracentesis tract. DiscoVisc was injected to replace  the aqueous and a continuous tear curvilinear capsulorhexis was performed using a bent 25-gauge needle.  Balance salt on a syringe was used to perform hydro-dissection and phacoemulsification was carried out using a divide and conquer technique. Procedure(s) with comments: CATARACT EXTRACTION PHACO AND INTRAOCULAR LENS PLACEMENT (IOC) (Right) - US 01:03 AP% 23.7 CDE 27.14 fluid pack lot # [id number]. Irrigation/aspiration was used to remove the residual cortex and the capsular bag was inflated with DiscoVisc. The intraocular lens was inserted into the capsular bag using a pre-loaded UltraSert Delivery System. Irrigation/aspiration was used to remove the residual DiscoVisc. The wound was inflated with balanced salt and checked for leaks. None were found. Miostat was injected via the paracentesis track and 0.1 ml of cefuroxime containing 1 mg of drug  was injected via the paracentesis track. The wound was checked for leaks again and none were found.   The bridal sutures were removed and two drops of Vigamox were placed on the eye. An eye shield was placed to protect the eye and the patient was discharged to the recovery area in good condition.   Daveion Robar MD

## 2015-07-30 NOTE — Interval H&P Note (Signed)
History and Physical Interval Note:  07/30/2015 7:30 AM  Anne Sharp  has presented today for surgery, with the diagnosis of nuclear sclerotic cataract right eye  The various methods of treatment have been discussed with the patient and family. After consideration of risks, benefits and other options for treatment, the patient has consented to  Procedure(s) with comments: CATARACT EXTRACTION PHACO AND INTRAOCULAR LENS PLACEMENT (IOC) (Right) - US AP% CDE fluid pack lot # [id number] as a surgical intervention .  The patient's history has been reviewed, patient examined, no change in status, stable for surgery.  I have reviewed the patient's chart and labs.  Questions were answered to the patient's satisfaction.     Saurabh Hettich

## 2015-07-30 NOTE — Anesthesia Preprocedure Evaluation (Signed)
Anesthesia Evaluation  Patient identified by MRN, date of birth, ID band Patient awake    Reviewed: Allergy & Precautions, NPO status , Patient's Chart, lab work & pertinent test results, reviewed documented beta blocker date and time   Airway Mallampati: III  TM Distance: >3 FB     Dental  (+) Chipped   Pulmonary shortness of breath and with exertion, pneumonia, resolved, former smoker,           Cardiovascular hypertension, Pt. on medications + angina      Neuro/Psych negative neurological ROS  negative psych ROS   GI/Hepatic negative GI ROS, Neg liver ROS,   Endo/Other    Renal/GU negative Renal ROS     Musculoskeletal  (+) Arthritis ,   Abdominal   Peds  Hematology negative hematology ROS (+)   Anesthesia Other Findings Obese.EKG and ECHO OK. Temporary tooth.  Reproductive/Obstetrics                             Anesthesia Physical  Anesthesia Plan  ASA: III  Anesthesia Plan: MAC and General   Post-op Pain Management:    Induction: Intravenous  Airway Management Planned: Nasal Cannula  Additional Equipment:   Intra-op Plan:   Post-operative Plan:   Informed Consent: I have reviewed the patients History and Physical, chart, labs and discussed the procedure including the risks, benefits and alternatives for the proposed anesthesia with the patient or authorized representative who has indicated his/her understanding and acceptance.     Plan Discussed with: CRNA  Anesthesia Plan Comments:         Anesthesia Quick Evaluation

## 2015-07-30 NOTE — Discharge Instructions (Signed)
Eye Surgery Discharge Instructions  Expect mild scratchy sensation or mild soreness. DO NOT RUB YOUR EYE!  The day of surgery:  Minimal physical activity, but bed rest is not required  No reading, computer work, or close hand work  No bending, lifting, or straining.  May watch TV  For 24 hours:  No driving, legal decisions, or alcoholic beverages  Safety precautions  Eat anything you prefer: It is better to start with liquids, then soup then solid foods.  _____ Eye patch should be worn until postoperative exam tomorrow.  ____ Solar shield eyeglasses should be worn for comfort in the sunlight/patch while sleeping  Resume all regular medications including aspirin or Coumadin if these were discontinued prior to surgery. You may shower, bathe, shave, or wash your hair. Tylenol may be taken for mild discomfort.  Call your doctor if you experience significant pain, nausea, or vomiting, fever > 101 or other signs of infection. [phone] or [phone] Specific instructions:  Follow-up Information    Follow up with Sallee LangeINGELDEIN,Taishawn Smaldone, MD.   Specialty:  Ophthalmology   Why:  follow up 7/6 at 1045   Contact information:   [address]   [place] [id number] [phone]     AMBULATORY SURGERY  DISCHARGE INSTRUCTIONS   1) The drugs that you were given will stay in your system until tomorrow so for the next 24 hours you should not:  A) Drive an automobile B) Make any legal decisions C) Drink any alcoholic beverage   2) You may resume regular meals tomorrow.  Today it is better to start with liquids and gradually work up to solid foods.  You may eat anything you prefer, but it is better to start with liquids, then soup and crackers, and gradually work up to solid foods.   3) Please notify your doctor immediately if you have any unusual bleeding, trouble breathing, redness and pain at the surgery site, drainage, fever, or pain not relieved by  medication.    4) Additional Instructions:        Please contact your physician with any problems or Same Day Surgery at [phone], Monday through Friday 6 am to 4 pm, or [place] at [facility] Main number at [phone].

## 2015-07-31 ENCOUNTER — Encounter: Payer: Self-pay | Admitting: Ophthalmology

## 2016-01-02 ENCOUNTER — Encounter: Payer: Medicare Other | Admitting: Internal Medicine

## 2016-04-22 ENCOUNTER — Other Ambulatory Visit: Payer: Self-pay | Admitting: Gastroenterology

## 2016-04-22 DIAGNOSIS — R1032 Left lower quadrant pain: Secondary | ICD-10-CM

## 2016-04-23 ENCOUNTER — Inpatient Hospital Stay
Admission: EM | Admit: 2016-04-23 | Discharge: 2016-04-24 | DRG: 392 | Disposition: A | Discharge status: Home / Self Care | Payer: Medicare Other | Attending: Internal Medicine | Admitting: Internal Medicine

## 2016-04-23 ENCOUNTER — Ambulatory Visit
Admission: RE | Admit: 2016-04-23 | Discharge: 2016-04-23 | Disposition: A | Discharge status: Home / Self Care | Payer: Medicare Other | Source: Ambulatory Visit | Attending: Gastroenterology | Admitting: Gastroenterology

## 2016-04-23 ENCOUNTER — Encounter: Payer: Self-pay | Admitting: Emergency Medicine

## 2016-04-23 DIAGNOSIS — N289 Disorder of kidney and ureter, unspecified: Secondary | ICD-10-CM | POA: Diagnosis present

## 2016-04-23 DIAGNOSIS — Z7951 Long term (current) use of inhaled steroids: Secondary | ICD-10-CM | POA: Diagnosis not present

## 2016-04-23 DIAGNOSIS — E860 Dehydration: Secondary | ICD-10-CM

## 2016-04-23 DIAGNOSIS — Z87891 Personal history of nicotine dependence: Secondary | ICD-10-CM

## 2016-04-23 DIAGNOSIS — K5720 Diverticulitis of large intestine with perforation and abscess without bleeding: Principal | ICD-10-CM | POA: Diagnosis present

## 2016-04-23 DIAGNOSIS — N390 Urinary tract infection, site not specified: Secondary | ICD-10-CM | POA: Diagnosis present

## 2016-04-23 DIAGNOSIS — R739 Hyperglycemia, unspecified: Secondary | ICD-10-CM | POA: Diagnosis present

## 2016-04-23 DIAGNOSIS — I10 Essential (primary) hypertension: Secondary | ICD-10-CM | POA: Diagnosis present

## 2016-04-23 DIAGNOSIS — Z79899 Other long term (current) drug therapy: Secondary | ICD-10-CM | POA: Diagnosis not present

## 2016-04-23 DIAGNOSIS — Z90710 Acquired absence of both cervix and uterus: Secondary | ICD-10-CM | POA: Diagnosis not present

## 2016-04-23 DIAGNOSIS — R197 Diarrhea, unspecified: Secondary | ICD-10-CM

## 2016-04-23 DIAGNOSIS — R1032 Left lower quadrant pain: Secondary | ICD-10-CM

## 2016-04-23 LAB — URINALYSIS, COMPLETE (UACMP) WITH MICROSCOPIC
BACTERIA UA: NONE SEEN
BILIRUBIN URINE: NEGATIVE
GLUCOSE, UA: NEGATIVE mg/dL
Hgb urine dipstick: NEGATIVE
KETONES UR: NEGATIVE mg/dL
LEUKOCYTES UA: NEGATIVE
Nitrite: NEGATIVE
PROTEIN: NEGATIVE mg/dL
Specific Gravity, Urine: >1.046 — ABNORMAL HIGH (ref 1.005–1.030)
WBC, UA: NONE SEEN WBC/hpf (ref 0–5)
pH: 5 (ref 5.0–8.0)

## 2016-04-23 LAB — COMPREHENSIVE METABOLIC PANEL
ALT: 13 U/L — ABNORMAL LOW (ref 14–54)
ANION GAP: 6 (ref 5–15)
AST: 19 U/L (ref 15–41)
Albumin: 3.4 g/dL — ABNORMAL LOW (ref 3.5–5.0)
Alkaline Phosphatase: 53 U/L (ref 38–126)
BUN: 14 mg/dL (ref 6–20)
CHLORIDE: 107 mmol/L (ref 101–111)
CO2: 26 mmol/L (ref 22–32)
Calcium: 9 mg/dL (ref 8.9–10.3)
Creatinine, Ser: 0.82 mg/dL (ref 0.44–1.00)
Glucose, Bld: 111 mg/dL — ABNORMAL HIGH (ref 65–99)
POTASSIUM: 3.6 mmol/L (ref 3.5–5.1)
Sodium: 139 mmol/L (ref 135–145)
Total Bilirubin: 0.2 mg/dL — ABNORMAL LOW (ref 0.3–1.2)
Total Protein: 8.2 g/dL — ABNORMAL HIGH (ref 6.5–8.1)

## 2016-04-23 LAB — CBC
HEMATOCRIT: 37.7 % (ref 35.0–47.0)
Hemoglobin: 12.4 g/dL (ref 12.0–16.0)
MCH: 26.1 pg (ref 26.0–34.0)
MCHC: 32.8 g/dL (ref 32.0–36.0)
MCV: 79.7 fL — AB (ref 80.0–100.0)
PLATELETS: 389 10*3/uL (ref 150–440)
RBC: 4.73 MIL/uL (ref 3.80–5.20)
RDW: 14.5 % (ref 11.5–14.5)
WBC: 6.3 10*3/uL (ref 3.6–11.0)

## 2016-04-23 LAB — LIPASE, BLOOD: LIPASE: 26 U/L (ref 11–51)

## 2016-04-23 MED ORDER — OLOPATADINE HCL 0.1 % OP SOLN
1.0000 [drp] | OPHTHALMIC | Status: DC | PRN
  Filled 2016-04-23: qty 5

## 2016-04-23 MED ORDER — CIPROFLOXACIN IN D5W 400 MG/200ML IV SOLN
400.0000 mg | Freq: Two times a day (BID) | INTRAVENOUS | Status: DC
  Administered 2016-04-23 – 2016-04-24 (×2): 400 mg via INTRAVENOUS
  Filled 2016-04-23 (×3): qty 200

## 2016-04-23 MED ORDER — HYDROCODONE-ACETAMINOPHEN 5-325 MG PO TABS: 1.0000 | ORAL_TABLET | ORAL | Status: DC | PRN

## 2016-04-23 MED ORDER — IOPAMIDOL (ISOVUE-300) INJECTION 61%
100.0000 mL | Freq: Once | INTRAVENOUS | Status: AC | PRN
  Administered 2016-04-23: 100 mL via INTRAVENOUS

## 2016-04-23 MED ORDER — SODIUM CHLORIDE 0.9% FLUSH: 3.0000 mL | Freq: Two times a day (BID) | INTRAVENOUS | Status: DC

## 2016-04-23 MED ORDER — PIPERACILLIN-TAZOBACTAM 3.375 G IVPB
INTRAVENOUS | Status: AC
Start: 2016-04-23 — End: 2016-04-24
  Filled 2016-04-23: qty 50

## 2016-04-23 MED ORDER — CYCLOBENZAPRINE HCL 10 MG PO TABS
10.0000 mg | ORAL_TABLET | Freq: Two times a day (BID) | ORAL | Status: DC
  Filled 2016-04-23 (×2): qty 1

## 2016-04-23 MED ORDER — FLUTICASONE PROPIONATE 50 MCG/ACT NA SUSP
1.0000 | Freq: Every day | NASAL | Status: DC | PRN
  Filled 2016-04-23: qty 16

## 2016-04-23 MED ORDER — LORATADINE 10 MG PO TABS: 10.0000 mg | ORAL_TABLET | Freq: Every day | ORAL | Status: DC | PRN

## 2016-04-23 MED ORDER — POTASSIUM CHLORIDE IN NACL 20-0.9 MEQ/L-% IV SOLN
INTRAVENOUS | Status: DC
  Administered 2016-04-23: 22:00:00 via INTRAVENOUS
  Filled 2016-04-23 (×4): qty 1000

## 2016-04-23 MED ORDER — ACETAMINOPHEN 650 MG RE SUPP: 650.0000 mg | Freq: Four times a day (QID) | RECTAL | Status: DC | PRN

## 2016-04-23 MED ORDER — ACETAMINOPHEN 325 MG PO TABS: 650.0000 mg | ORAL_TABLET | Freq: Four times a day (QID) | ORAL | Status: DC | PRN

## 2016-04-23 MED ORDER — ENOXAPARIN SODIUM 40 MG/0.4ML ~~LOC~~ SOLN
40.0000 mg | SUBCUTANEOUS | Status: DC
  Filled 2016-04-23: qty 0.4

## 2016-04-23 MED ORDER — PIPERACILLIN-TAZOBACTAM 3.375 G IVPB 30 MIN
3.3750 g | Freq: Once | INTRAVENOUS | Status: AC
  Administered 2016-04-23: 3.375 g via INTRAVENOUS

## 2016-04-23 MED ORDER — ONDANSETRON HCL 4 MG/2ML IJ SOLN: 4.0000 mg | Freq: Four times a day (QID) | INTRAMUSCULAR | Status: DC | PRN

## 2016-04-23 MED ORDER — METRONIDAZOLE IN NACL 5-0.79 MG/ML-% IV SOLN
500.0000 mg | Freq: Three times a day (TID) | INTRAVENOUS | Status: DC
  Administered 2016-04-23 – 2016-04-24 (×3): 500 mg via INTRAVENOUS
  Filled 2016-04-23 (×4): qty 100

## 2016-04-23 MED ORDER — ONDANSETRON HCL 4 MG PO TABS: 4.0000 mg | ORAL_TABLET | Freq: Four times a day (QID) | ORAL | Status: DC | PRN

## 2016-04-23 NOTE — H&P (Signed)
[facility] [place] at [facility]   PATIENT NAME: Anne Sharp    MR#:  [id number]  DATE OF BIRTH:  1945/10/26  DATE OF ADMISSION:  04/23/2016  PRIMARY CARE PHYSICIAN: Marisue Ivan, MD   REQUESTING/REFERRING PHYSICIAN:   CHIEF COMPLAINT:   Chief Complaint  Patient presents with  . Abdominal Pain    HISTORY OF PRESENT ILLNESS: Anne Sharp  is a 71 y.o. female with a known history of essential hypertension, seasonal allergies, dyspnea, bronchitis, who presents to the hospital with complaints of abdominal pain, diarrhea, nausea, poor oral intake. According to the patient, she was visiting [place] in February for 2 weeks. During this trip, she developed traveler's diarrhea for which she took medications. It lasted a couple of days, however, loose stool, never subsided completely. She returned back from [place] on 03/25/2016, however, since her trip. She is having intermittent, very loose stools and about a week ago she started noticing left lower quadrant abdominal pain. Was described as steady achiness, increasing whenever she would lay down. Pain was intermittent increasing whenever she ate it radiated to her right side of the abdomen. Patient admitted that she's been having that is similar abdominal pains for the past 2 years, however, worsening of the pain happened about a week ago. The patient also lost appetite, she's been having nausea and intermittent vomiting. She was seen by PCP, with stool samples and blood tests were performed and patient was referred to gastroenterologist. Patient underwent CT of the abdomen and pelvis today, which revealed diverticulitis with small abscess, she was sent to emergency room for admission. She denies any fevers or chills, however, admits of significant sweating for the past week. Hospitalist services were contacted for admission. Patient was initiated on Zosyn, and she started having significant itching of the skin.Marland Kitchen    PAST MEDICAL HISTORY:   Past Medical History:  Diagnosis Date  . Anginal pain (HCC)   . Arthritis   . Bronchitis   . Cat allergies   . Hypertension   . Pain    JAW      POSSIBLE TMJ   RECENT   . Pneumonia   . Seasonal allergies    NASAL CONGESTION/TEARY EYES  . Shortness of breath dyspnea   . Vertigo     PAST SURGICAL HISTORY: Past Surgical History:  Procedure Laterality Date  . ABDOMINAL HYSTERECTOMY    . CARPAL TUNNEL RELEASE Right   . CATARACT EXTRACTION W/PHACO Left 06/02/2015   Procedure: CATARACT EXTRACTION PHACO AND INTRAOCULAR LENS PLACEMENT (IOC);  Surgeon: Sallee Lange, MD;  Location: [facility] ORS;  Service: Ophthalmology;  Laterality: Left;  [place] 01:21 AP% 22.9 CDE 32.25 fluid pack lot # [id number]  . CATARACT EXTRACTION W/PHACO Right 07/30/2015   Procedure: CATARACT EXTRACTION PHACO AND INTRAOCULAR LENS PLACEMENT (IOC);  Surgeon: Sallee Lange, MD;  Location: [facility] ORS;  Service: Ophthalmology;  Laterality: Right;  [place] 01:03 AP% 23.7 CDE 27.14 fluid pack lot # [id number]  . EYE SURGERY      SOCIAL HISTORY:  Social History  Substance Use Topics  . Smoking status: Former Games developer  . Smokeless tobacco: Never Used  . Alcohol use Yes     Comment: occasionally    FAMILY HISTORY: History reviewed. No pertinent family history.  DRUG ALLERGIES: No Known Allergies  Review of Systems  Constitutional: Negative for chills, fever and weight loss.  HENT: Negative for congestion.   Eyes: Negative for blurred vision and double vision.  Respiratory: Negative for cough, sputum  production, shortness of breath and wheezing.   Cardiovascular: Negative for chest pain, palpitations, orthopnea, leg swelling and PND.  Gastrointestinal: Positive for abdominal pain, diarrhea, nausea and vomiting. Negative for blood in stool and constipation.  Genitourinary: Negative for dysuria, frequency, hematuria and urgency.  Musculoskeletal: Negative for falls.  Neurological: Negative for  dizziness, tremors, focal weakness and headaches.  Endo/Heme/Allergies: Does not bruise/bleed easily.  Psychiatric/Behavioral: Negative for depression. The patient does not have insomnia.     MEDICATIONS AT HOME:  Prior to Admission medications   Medication Sig Start Date End Date Taking? Authorizing Provider  cyclobenzaprine (FLEXERIL) 10 MG tablet Take 10 mg by mouth 2 (two) times daily.    Historical Provider, MD  diflunisal (DOLOBID) 500 MG TABS tablet Take 500 mg by mouth.    Historical Provider, MD  fluticasone (FLONASE) 50 MCG/ACT nasal spray Place 1 spray into both nostrils daily.    Historical Provider, MD  hydrochlorothiazide (MICROZIDE) 12.5 MG capsule Take 12.5 mg by mouth daily.    Historical Provider, MD  ibuprofen (ADVIL,MOTRIN) 600 MG tablet Take 600 mg by mouth every 6 (six) hours as needed for moderate pain.     Historical Provider, MD  loratadine (CLARITIN) 10 MG tablet Take 10 mg by mouth daily.     Historical Provider, MD  Olopatadine HCl (PAZEO) 0.7 % SOLN Apply 1 drop to eye as needed.    Historical Provider, MD      PHYSICAL EXAMINATION:   VITAL SIGNS: Blood pressure (!) 156/69, pulse 96, temperature 98.3 F (36.8 C), temperature source Oral, resp. rate 20, height  (1.626 m), weight 91.2 kg (201 lb), SpO2 100 %.  GENERAL:  71 y.o.-year-old patient lying in the bed In mild distress, itching and scratching all her body, also very restless leg and down and sitting up at the bedside  EYES: Pupils equal, round, reactive to light and accommodation. No scleral icterus. Extraocular muscles intact.  HEENT: Head atraumatic, normocephalic. Oropharynx and nasopharynx clear.  NECK:  Supple, no jugular venous distention. No thyroid enlargement, no tenderness.  LUNGS: Normal breath sounds bilaterally, no wheezing, rales,rhonchi or crepitation. No use of accessory muscles of respiration.  CARDIOVASCULAR: S1, S2 normal. No murmurs, rubs, or gallops.  ABDOMEN: Soft, mildly  tender in left lower quadrant without rebound or guarding was noted, nondistended. Bowel sounds present. No organomegaly or mass.  EXTREMITIES: No pedal edema, cyanosis, or clubbing.  NEUROLOGIC: Cranial nerves II through XII are intact. Muscle strength 5/5 in all extremities. Sensation intact. Gait not checked.  PSYCHIATRIC: The patient is alert and oriented x 3.  SKIN: No obvious rash, lesion, or ulcer. But defined rash was noted of the skin, patient scratches intently on her upper and lower extremities  LABORATORY PANEL:   CBC  Recent Labs Lab 04/23/16 1405  WBC 6.3  HGB 12.4  HCT 37.7  PLT 389  MCV 79.7*  MCH 26.1  MCHC 32.8  RDW 14.5   ------------------------------------------------------------------------------------------------------------------  Chemistries   Recent Labs Lab 04/23/16 1405  NA 139  K 3.6  CL 107  CO2 26  GLUCOSE 111*  BUN 14  CREATININE 0.82  CALCIUM 9.0  AST 19  ALT 13*  ALKPHOS 53  BILITOT 0.2*   ------------------------------------------------------------------------------------------------------------------  Cardiac Enzymes No results for input(s): TROPONINI in the last 168 hours. ------------------------------------------------------------------------------------------------------------------  RADIOLOGY: Ct Abdomen Pelvis W Contrast  Result Date: 04/23/2016 CLINICAL DATA:  Nausea, vomiting, constipation and low abdominal pain for 4 weeks. Previous hysterectomy. No evidence of malignancy.  EXAM: CT ABDOMEN AND PELVIS WITH CONTRAST TECHNIQUE: Multidetector CT imaging of the abdomen and pelvis was performed using the standard protocol following bolus administration of intravenous contrast. CONTRAST:  ISOVUE-300 IOPAMIDOL (ISOVUE-300) INJECTION 61% COMPARISON:  CT 04/08/2014. FINDINGS: Lower chest: Probable chronic mucous impacted bronchus at the left lung base (images 6 and 7), stable. No confluent airspace opacity, pleural or  pericardial effusion. The heart is mildly enlarged. Hepatobiliary: The liver is normal in density without focal abnormality. No evidence of gallstones, gallbladder wall thickening or biliary dilatation. Pancreas: Unremarkable. No pancreatic ductal dilatation or surrounding inflammatory changes. Spleen: Normal in size without focal abnormality. Adrenals/Urinary Tract: Both adrenal glands appear normal. 19 mm lesion in the upper pole the left kidney has enlarged from the previous study at which time it measured 12 mm. This has a nonspecific density (21 HU on the immediate postcontrast images and 34 HU on the delayed images). No other renal lesions are identified. There is no evidence of urinary tract calculus or hydronephrosis. The bladder appears normal. There is no gas within the bladder lumen. Stomach/Bowel: The stomach, small bowel, appendix and proximal colon demonstrate no significant findings. There are diverticular changes throughout the sigmoid colon with associated wall thickening and inflammation in the surrounding fat. The enteric contrast has not yet passed through the distal colon. There is a 13 x 24 mm fluid collection superior to the sigmoid colon on image 51. Based on the previous distribution of the colon, suggested extraluminal air and fluid on the right on image 59 appears to be due to inflammation of the sigmoid colon itself. No evidence of bowel obstruction or free intraperitoneal air. Vascular/Lymphatic: There are no enlarged abdominal or pelvic lymph nodes. There is aortic and branch vessel atherosclerosis. No acute vascular findings are seen. Reproductive: Hysterectomy. No evidence of adnexal mass. Probable residual ovarian tissue on the left, similar to previous study. Other: Small amount of pelvic ascites. Musculoskeletal: No acute or significant osseous findings. Extensive degenerative changes throughout the lower lumbar spine with ankylosis across the lower 2 disc spaces. IMPRESSION: 1.  Acute sigmoid diverticulosis with pericolonic inflammation and small pericolonic abscess. No easily drainable fluid collection identified at this time. 2. No evidence of bowel obstruction. 3. Indeterminate enlarging left renal lesion, possibly a complex cyst. Atypical neoplasm cannot be excluded by this examination. Follow-up imaging with dedicated renal CT or MRI recommended, ideally after the patient's acute episode has resolved. 4. Diffuse atherosclerosis. 5. These results will be called to the ordering clinician or representative by the Radiology Department at the imaging location. Electronically Signed   By: Carey Bullocks M.D.   On: 04/23/2016 10:52    EKG: No orders found for this or any previous visit.  IMPRESSION AND PLAN:  Active Problems:   Diverticulitis of large intestine with abscess   Dehydration   Diarrhea   Hyperglycemia   Diverticulitis of large intestine with perforation and abscess #1Diverticulitis of large intestine with perforation and abscess, admit patient to medical floor, initiate patient on ciprofloxacin and Flagyl, pain medications, get gastroenterologist involved for further recommendations, follow clinically #2. Diarrhea, repeat stool cultures including, and parasites, continue Flagyl   #3 dehydration, I judged by elevated urine specific gravity, continue IV fluids #4. Hyperglycemia, check hemoglobin A1c to rule out diabetes #5, itching, rash, concerning for allergy to Zosyn, give patient few doses of Benadryl, stop Zosyn   All the records are reviewed and case discussed with ED provider. Management plans discussed with the patient, family and  they are in agreement.  CODE STATUS: Code Status History    This patient does not have a recorded code status. Please follow your organizational policy for patients in this situation.       TOTAL TIME TAKING CARE OF THIS PATIENT: 50 minutes.    Katharina Caper M.D on 04/23/2016 at 6:11 PM  Between 7am to 6pm -  Pager - [phone] After 6pm go to [email or web address] - password [email or web address] [facility]  [facility]  Office  [phone]  CC: Primary care physician; Marisue Ivan, MD

## 2016-04-23 NOTE — ED Triage Notes (Signed)
Pt to ed with c/o abd pain, sent from GI md office due to diverticulitis on CT scan.

## 2016-04-23 NOTE — ED Provider Notes (Signed)
[facility] Emergency Department Provider Note   ____________________________________________    I have reviewed the triage vital signs and the nursing notes.   HISTORY  Chief Complaint Abdominal Pain     HPI Anne Sharp is a 71 y.o. female who presents with complaints of left lower quadrant abdominal pain. Patient was seen by gastroenterology today and had CT scan and lab works which demonstrated diverticulitis with abscess. Patient reports that she has been here she is actually had some improvement. She does report that she traveled to [place] one month ago and reports her pain has been intermittent over the last 2 weeks and has been very severe at times. In addition she has had nausea and severe diarrhea. No fevers reported.   Past Medical History:  Diagnosis Date  . Anginal pain (HCC)   . Arthritis   . Bronchitis   . Cat allergies   . Hypertension   . Pain    JAW      POSSIBLE TMJ   RECENT   . Pneumonia   . Seasonal allergies    NASAL CONGESTION/TEARY EYES  . Shortness of breath dyspnea   . Vertigo     Patient Active Problem List   Diagnosis Date Noted  . Diverticulitis of large intestine with abscess 04/23/2016  . Dehydration 04/23/2016  . Diarrhea 04/23/2016  . Hyperglycemia 04/23/2016  . Diverticulitis of large intestine with perforation and abscess 04/23/2016    Past Surgical History:  Procedure Laterality Date  . ABDOMINAL HYSTERECTOMY    . CARPAL TUNNEL RELEASE Right   . CATARACT EXTRACTION W/PHACO Left 06/02/2015   Procedure: CATARACT EXTRACTION PHACO AND INTRAOCULAR LENS PLACEMENT (IOC);  Surgeon: Sallee Lange, MD;  Location: [facility] ORS;  Service: Ophthalmology;  Laterality: Left;  [place] 01:21 AP% 22.9 CDE 32.25 fluid pack lot # [id number]  . CATARACT EXTRACTION W/PHACO Right 07/30/2015   Procedure: CATARACT EXTRACTION PHACO AND INTRAOCULAR LENS PLACEMENT (IOC);  Surgeon: Sallee Lange, MD;  Location: [facility] ORS;  Service:  Ophthalmology;  Laterality: Right;  [place] 01:03 AP% 23.7 CDE 27.14 fluid pack lot # [id number]  . EYE SURGERY      Prior to Admission medications   Medication Sig Start Date End Date Taking? Authorizing Provider  cyclobenzaprine (FLEXERIL) 10 MG tablet Take 10 mg by mouth 2 (two) times daily.    Historical Provider, MD  diflunisal (DOLOBID) 500 MG TABS tablet Take 500 mg by mouth.    Historical Provider, MD  fluticasone (FLONASE) 50 MCG/ACT nasal spray Place 1 spray into both nostrils daily.    Historical Provider, MD  hydrochlorothiazide (MICROZIDE) 12.5 MG capsule Take 12.5 mg by mouth daily.    Historical Provider, MD  ibuprofen (ADVIL,MOTRIN) 600 MG tablet Take 600 mg by mouth every 6 (six) hours as needed for moderate pain.     Historical Provider, MD  loratadine (CLARITIN) 10 MG tablet Take 10 mg by mouth daily.     Historical Provider, MD  Olopatadine HCl (PAZEO) 0.7 % SOLN Apply 1 drop to eye as needed.    Historical Provider, MD     Allergies Patient has no known allergies.  History reviewed. No pertinent family history.  Social History Social History  Substance Use Topics  . Smoking status: Former Games developer  . Smokeless tobacco: Never Used  . Alcohol use Yes     Comment: occasionally    Review of Systems  Constitutional: No fever/chills Eyes: No visual changes.   Cardiovascular: Denies chest pain. Respiratory: Denies  shortness of breath. Gastrointestinal: As above Genitourinary: Negative for dysuria. Musculoskeletal: Negative for back pain. Skin: Negative for rash. Neurological: Negative for headaches   10-point ROS otherwise negative.  ____________________________________________   PHYSICAL EXAM:  VITAL SIGNS: ED Triage Vitals  Enc Vitals Group     BP 04/23/16 1359 (!) 156/69     Pulse Rate 04/23/16 1359 96     Resp 04/23/16 1359 20     Temp 04/23/16 1359 98.3 F (36.8 C)     Temp Source 04/23/16 1359 Oral     SpO2 04/23/16 1359 100 %     Weight  04/23/16 1359 201 lb (91.2 kg)     Height 04/23/16 1359  (1.626 m)     Head Circumference --      Peak Flow --      Pain Score 04/23/16 1358 6     Pain Loc --      Pain Edu? --      Excl. in GC? --     Constitutional: Alert and oriented. No acute distress. Pleasant and interactive Eyes: Conjunctivae are normal.  Head: Atraumatic. Nose: No congestion/rhinnorhea. Mouth/Throat: Mucous membranes are moist.    Cardiovascular: Normal rate, regular rhythm. Grossly normal heart sounds.  Good peripheral circulation. Respiratory: Normal respiratory effort.  No retractions. Lungs CTAB. Gastrointestinal:Moderate Tenderness to palpation in the left lower quadrant. No distention.  No CVA tenderness. Genitourinary: deferred Musculoskeletal: No lower extremity tenderness nor edema.  Warm and well perfused Neurologic:  Normal speech and language. No gross focal neurologic deficits are appreciated.  Skin:  Skin is warm, dry and intact. No rash noted. Psychiatric: Mood and affect are normal. Speech and behavior are normal.  ____________________________________________   LABS (all labs ordered are listed, but only abnormal results are displayed)  Labs Reviewed  COMPREHENSIVE METABOLIC PANEL - Abnormal; Notable for the following:       Result Value   Glucose, Bld 111 (*)    Total Protein 8.2 (*)    Albumin 3.4 (*)    ALT 13 (*)    Total Bilirubin 0.2 (*)    All other components within normal limits  CBC - Abnormal; Notable for the following:    MCV 79.7 (*)    All other components within normal limits  URINALYSIS, COMPLETE (UACMP) WITH MICROSCOPIC - Abnormal; Notable for the following:    Color, Urine YELLOW (*)    APPearance HAZY (*)    Specific Gravity, Urine >1.046 (*)    Squamous Epithelial / LPF 6-30 (*)    All other components within normal limits  LIPASE, BLOOD    ____________________________________________  EKG  None ____________________________________________  RADIOLOGY  Reviewed CT scan done earlier today ____________________________________________   PROCEDURES  Procedure(s) performed: No    Critical Care performed: No ____________________________________________   INITIAL IMPRESSION / ASSESSMENT AND PLAN / ED COURSE  Pertinent labs & imaging results that were available during my care of the patient were reviewed by me and considered in my medical decision making (see chart for details).  She presents with diverticulitis with abscess. Lab work is unremarkable. She is overall well-appearing and in no distress. Mild tenderness on exam but otherwise quite comfortable. Discussed with Dr. Evette Cristal of surgery who feels the patient probably needs antibiotics, discussed with medicine Dr. Seth Bake will admit.     ____________________________________________   FINAL CLINICAL IMPRESSION(S) / ED DIAGNOSES  Final diagnoses:  Diverticulitis of large intestine with abscess without bleeding      NEW MEDICATIONS  STARTED DURING THIS VISIT:  New Prescriptions   No medications on file     Note:  This document was prepared using Dragon voice recognition software and may include unintentional dictation errors.    Jene Every, MD 04/23/16 [phone]

## 2016-04-24 DIAGNOSIS — K5720 Diverticulitis of large intestine with perforation and abscess without bleeding: Secondary | ICD-10-CM | POA: Diagnosis not present

## 2016-04-24 DIAGNOSIS — R1032 Left lower quadrant pain: Secondary | ICD-10-CM | POA: Diagnosis not present

## 2016-04-24 LAB — CBC
HEMATOCRIT: 35.6 % (ref 35.0–47.0)
Hemoglobin: 11.5 g/dL — ABNORMAL LOW (ref 12.0–16.0)
MCH: 26.1 pg (ref 26.0–34.0)
MCHC: 32.4 g/dL (ref 32.0–36.0)
MCV: 80.5 fL (ref 80.0–100.0)
PLATELETS: 352 10*3/uL (ref 150–440)
RBC: 4.43 MIL/uL (ref 3.80–5.20)
RDW: 14.7 % — AB (ref 11.5–14.5)
WBC: 5.8 10*3/uL (ref 3.6–11.0)

## 2016-04-24 LAB — GASTROINTESTINAL PANEL BY PCR, STOOL (REPLACES STOOL CULTURE)
ADENOVIRUS F40/41: NOT DETECTED
ASTROVIRUS: NOT DETECTED
Campylobacter species: NOT DETECTED
Cryptosporidium: NOT DETECTED
Cyclospora cayetanensis: NOT DETECTED
ENTEROAGGREGATIVE E COLI (EAEC): NOT DETECTED
ENTEROPATHOGENIC E COLI (EPEC): NOT DETECTED
ENTEROTOXIGENIC E COLI (ETEC): NOT DETECTED
Entamoeba histolytica: NOT DETECTED
Giardia lamblia: NOT DETECTED
NOROVIRUS GI/GII: NOT DETECTED
Plesimonas shigelloides: NOT DETECTED
Rotavirus A: NOT DETECTED
SHIGA LIKE TOXIN PRODUCING E COLI (STEC): NOT DETECTED
Salmonella species: NOT DETECTED
Sapovirus (I, II, IV, and V): NOT DETECTED
Shigella/Enteroinvasive E coli (EIEC): NOT DETECTED
VIBRIO SPECIES: NOT DETECTED
Vibrio cholerae: NOT DETECTED
Yersinia enterocolitica: NOT DETECTED

## 2016-04-24 LAB — BASIC METABOLIC PANEL
Anion gap: 6 (ref 5–15)
BUN: 10 mg/dL (ref 6–20)
CALCIUM: 8.9 mg/dL (ref 8.9–10.3)
CO2: 28 mmol/L (ref 22–32)
CREATININE: 0.66 mg/dL (ref 0.44–1.00)
Chloride: 108 mmol/L (ref 101–111)
GFR calc Af Amer: >60 mL/min (ref >60)
GLUCOSE: 102 mg/dL — AB (ref 65–99)
Potassium: 3.7 mmol/L (ref 3.5–5.1)
SODIUM: 142 mmol/L (ref 135–145)

## 2016-04-24 MED ORDER — METRONIDAZOLE 500 MG PO TABS: 500.0000 mg | ORAL_TABLET | Freq: Three times a day (TID) | ORAL | 0 refills | Status: AC

## 2016-04-24 MED ORDER — LACTINEX PO CHEW: 1.0000 | CHEWABLE_TABLET | Freq: Three times a day (TID) | ORAL | 0 refills | Status: AC

## 2016-04-24 MED ORDER — CIPROFLOXACIN HCL 500 MG PO TABS: 500.0000 mg | ORAL_TABLET | Freq: Two times a day (BID) | ORAL | 0 refills | Status: AC

## 2016-04-24 NOTE — Consult Note (Signed)
Consultation  Referring Provider:     No ref. provider found Primary Care Physician:  Marisue Ivan, MD Primary Gastroenterologist: [facility] GI Reason for Consultation: diverticulitis  Date of Admission:  04/23/2016 Date of Consultation:  04/24/2016         HPI:   Anne Sharp is a 71 y.o. female a history notable for HTN who presents for abnormal imaging.   The patient reports that she was in her [place] until a 2 week trip to [place] in February. During that trip, she developed traveller's diarrhea and was treated with a 3 day course of antibiotics. Since then she never had formed BMs. She also had significant nausea, anorexia and some LLQ abdominal pain. She presented to the [facility] clinic walk-in about a week ago and had blood and stool tests. She does not recall the results of this testing. Last Friday and Saturday, she developed very severe LLQ abdominal pain, which felt like childbirth. In retrospect, she wonders if eating peanuts precipitated this pain. Therefore, she presented to the GI clinic again this past Thursday. She was prescribed antibiotics, but she was not able to pick them up due to her significant fatigue. She was scheduled to have a CT scan on Friday morning. After the CT scan, she received a phone call from the NP seeing her to present to the ER due to concerning imaging findings. She endorses recent cold sweats.   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 Her last colonoscopy was in 2011 in [place] and she believes she has a few more years before she is due for another one.   She denies vomiting, GI bleeding, fevers and sweats or rashes.  Past Medical History:  Diagnosis Date  . Anginal pain (HCC)   . Arthritis   . Bronchitis   . Cat allergies   . Hypertension   . Pain    JAW      POSSIBLE TMJ   RECENT   . Pneumonia   . Seasonal allergies    NASAL  CONGESTION/TEARY EYES  . Shortness of breath dyspnea   . Vertigo     Past Surgical History:  Procedure Laterality Date  . ABDOMINAL HYSTERECTOMY    . CARPAL TUNNEL RELEASE Right   . CATARACT EXTRACTION W/PHACO Left 06/02/2015   Procedure: CATARACT EXTRACTION PHACO AND INTRAOCULAR LENS PLACEMENT (IOC);  Surgeon: Sallee Lange, MD;  Location: [facility] ORS;  Service: Ophthalmology;  Laterality: Left;  [place] 01:21 AP% 22.9 CDE 32.25 fluid pack lot # [id number]  . CATARACT EXTRACTION W/PHACO Right 07/30/2015   Procedure: CATARACT EXTRACTION PHACO AND INTRAOCULAR LENS PLACEMENT (IOC);  Surgeon: Sallee Lange, MD;  Location: [facility] ORS;  Service: Ophthalmology;  Laterality: Right;  [place] 01:03 AP% 23.7 CDE 27.14 fluid pack lot # [id number]  . EYE SURGERY      Prior to Admission medications   Medication Sig Start Date End Date Taking? Authorizing Provider  fluticasone (FLONASE) 50 MCG/ACT nasal spray Place 1 spray into both nostrils daily.   Yes Historical Provider, MD  hydrochlorothiazide (MICROZIDE) 12.5 MG capsule Take 12.5 mg by mouth daily.   Yes Historical Provider, MD  loratadine (CLARITIN) 10 MG tablet Take 10 mg by mouth daily.    Yes Historical Provider, MD  ciprofloxacin (CIPRO) 500 MG tablet Take 1 tablet (500 mg total) by mouth 2 (two) times daily. 04/24/16 05/07/16  Adrian Saran, MD  lactobacillus acidophilus & bulgar (LACTINEX) chewable tablet Chew 1 tablet by mouth 3 (three) times daily with meals. 04/24/16 05/07/16  Adrian Saran, MD  metroNIDAZOLE (FLAGYL) 500 MG tablet Take 1 tablet (500 mg total) by mouth 3 (three) times daily. 04/24/16 05/07/16  Adrian Saran, MD    History reviewed. No pertinent family history.   Social History  Substance Use Topics  . Smoking status: Former Games developer  . Smokeless tobacco: Never Used  . Alcohol use Yes     Comment: occasionally    Allergies as of 04/23/2016  . (No Known Allergies)    Review of Systems:    All systems reviewed and negative except where  noted in HPI.   Physical Exam:  Vital signs in last 24 hours: Temp:  [97.6 F (36.4 C)-98 F (36.7 C)] 97.6 F (36.4 C) (03/31 0448) Pulse Rate:  [58-73] 58 (03/31 0448) Resp:  [16-20] 16 (03/31 0448) BP: (144-186)/(81-105) 144/81 (03/31 0448) SpO2:  [98 %-100 %] 98 % (03/31 0448) Weight:  [92.4 kg (203 lb 9.6 oz)] 92.4 kg (203 lb 9.6 oz) (03/31 0500) Last BM Date: 04/22/16   General: Older black female in NAD Head: Normocephalic and atraumatic. Eyes: No scleral icterus Ears: Normal auditory acuity. Neck: Supple; no masses or thyroidomegaly Lungs: Respirations even and unlabored. Lungs clear to auscultation bilaterally.   Heart: Regular rate and rhythm;  Without murmur, clicks, rubs or gallops Abdomen: +BS, soft, nondistended, mildly TTP in LLQ without r/g Rectal:  Not performed. Msk:  Symmetrical without gross deformities. Extremities: Without edema, cyanosis or clubbing. Neurologic:  Alert, grossly normal neurologically. Skin:  Intact without significant lesions or rashes. Psych:  Appropriate affect  LAB RESULTS:  Recent Labs  04/23/16 1405 04/24/16 0414  WBC 6.3 5.8  HGB 12.4 11.5*  HCT 37.7 35.6  PLT 389 352   BMET  Recent Labs  04/23/16 1405 04/24/16 0414  NA 139 142  K 3.6 3.7  CL 107 108  CO2 26 28  GLUCOSE 111* 102*  BUN 14 10  CREATININE 0.82 0.66  CALCIUM 9.0 8.9   LFT  Recent Labs  04/23/16 1405  PROT 8.2*  ALBUMIN 3.4*  AST 19  ALT 13*  ALKPHOS 53  BILITOT 0.2*   PT/INR No results for input(s): LABPROT, INR in the last 72 hours.  STUDIES: Ct Abdomen Pelvis W Contrast  Result Date: 04/23/2016 CLINICAL DATA:  Nausea, vomiting, constipation and low abdominal pain for 4 weeks. Previous hysterectomy. No evidence of malignancy. EXAM: CT ABDOMEN AND PELVIS WITH CONTRAST TECHNIQUE: Multidetector CT imaging of the abdomen and pelvis was performed using the standard protocol following bolus administration of intravenous contrast. CONTRAST:   ISOVUE-300 IOPAMIDOL (ISOVUE-300) INJECTION 61% COMPARISON:  CT 04/08/2014. FINDINGS: Lower chest: Probable chronic mucous impacted bronchus at the left lung base (images 6 and 7), stable. No confluent airspace opacity, pleural or pericardial effusion. The heart is mildly enlarged. Hepatobiliary: The liver is normal in density without focal abnormality. No evidence of gallstones, gallbladder wall thickening or biliary dilatation. Pancreas: Unremarkable. No pancreatic ductal dilatation or surrounding inflammatory changes. Spleen: Normal in size without focal abnormality. Adrenals/Urinary Tract: Both adrenal glands appear normal. 19 mm lesion in the upper pole the left kidney has enlarged from the previous study at which time it measured 12 mm. This has a nonspecific density (21 HU on the immediate postcontrast images and 34 HU on the delayed images). No other renal lesions are identified. There is no evidence of urinary tract calculus  or hydronephrosis. The bladder appears normal. There is no gas within the bladder lumen. Stomach/Bowel: The stomach, small bowel, appendix and proximal colon demonstrate no significant findings. There are diverticular changes throughout the sigmoid colon with associated wall thickening and inflammation in the surrounding fat. The enteric contrast has not yet passed through the distal colon. There is a 13 x 24 mm fluid collection superior to the sigmoid colon on image 51. Based on the previous distribution of the colon, suggested extraluminal air and fluid on the right on image 59 appears to be due to inflammation of the sigmoid colon itself. No evidence of bowel obstruction or free intraperitoneal air. Vascular/Lymphatic: There are no enlarged abdominal or pelvic lymph nodes. There is aortic and branch vessel atherosclerosis. No acute vascular findings are seen. Reproductive: Hysterectomy. No evidence of adnexal mass. Probable residual ovarian tissue on the left, similar to  previous study. Other: Small amount of pelvic ascites. Musculoskeletal: No acute or significant osseous findings. Extensive degenerative changes throughout the lower lumbar spine with ankylosis across the lower 2 disc spaces. IMPRESSION: 1. Acute sigmoid diverticulosis with pericolonic inflammation and small pericolonic abscess. No easily drainable fluid collection identified at this time. 2. No evidence of bowel obstruction. 3. Indeterminate enlarging left renal lesion, possibly a complex cyst. Atypical neoplasm cannot be excluded by this examination. Follow-up imaging with dedicated renal CT or MRI recommended, ideally after the patient's acute episode has resolved. 4. Diffuse atherosclerosis. 5. These results will be called to the ordering clinician or representative by the Radiology Department at the imaging location. Electronically Signed   By: Carey Bullocks M.D.   On: 04/23/2016 10:52      Impression / Plan:   Anne Sharp is a 71 y.o. y/o female with a history notable for HTN who presents for evaluation of perforated diverticulitis. She is receiving treatment with IV antibiotics and she certainly feels much better after this. I recommend a 48 hour course of IV antibiotics and follow up tomorrow to assess her response prior to discharge home to complete a 7 day course of antibiotics. Her imaging does not reveal any discrete abscesses to drain. She will certainly require an interval colonoscopy in 6-8 weeks.   - advance to regular diet - continue IV abx while hospitalized - if stable, discharge tomorrow to complete a 7 day course of antibiotics - outpatient colonoscopy in 6-8 weeks  Thank you for involving me in the care of this patient.     LOS: 1 day   Bluford Kaufmann, MD  04/24/2016, 3:45 PM

## 2016-04-24 NOTE — Consult Note (Signed)
Reason for Consult:abdominal pain Referring Physician:S. Benjie Karvonen  Anne Sharp is an 71 y.o. female.  HPI: 71 year old female who presented yesterday to the emergency room with complaint of some lower abdominal pain. She has been having some pain off and on in the lower abdomen for quite some time now but apparently his pain was significantly worse yesterday and she was evaluated by GI service. CT scan was obtained showing evidence of diverticulitis with a very small pericolonic abscess. She was subsequently referred the emergency room and admitted here for IV antibiotics overnight. The patient has had a colonoscopy a few years ago which showed evidence of diverticulosis. She has however had no documentation of actual diverticulitis.. This a.m. the patient states he is feeling a whole lot better the the pain is not anything significant. She also stated that she has some different kind of pain THE lower abdomen negative time she tries to urinate.  Past Medical History:  Diagnosis Date  . Anginal pain ([place])   . Arthritis   . Bronchitis   . Cat allergies   . Hypertension   . Pain    JAW      POSSIBLE TMJ   RECENT   . Pneumonia   . Seasonal allergies    NASAL CONGESTION/TEARY EYES  . Shortness of breath dyspnea   . Vertigo     Past Surgical History:  Procedure Laterality Date  . ABDOMINAL HYSTERECTOMY    . CARPAL TUNNEL RELEASE Right   . CATARACT EXTRACTION W/PHACO Left 06/02/2015   Procedure: CATARACT EXTRACTION PHACO AND INTRAOCULAR LENS PLACEMENT (IOC);  Surgeon: Estill Cotta, MD;  Location: [facility] ORS;  Service: Ophthalmology;  Laterality: Left;  [place] 01:21 AP% 22.9 CDE 32.25 fluid pack lot # [id number]  . CATARACT EXTRACTION W/PHACO Right 07/30/2015   Procedure: CATARACT EXTRACTION PHACO AND INTRAOCULAR LENS PLACEMENT (IOC);  Surgeon: Estill Cotta, MD;  Location: [facility] ORS;  Service: Ophthalmology;  Laterality: Right;  [place] 01:03 AP% 23.7 CDE 27.14 fluid pack lot # [id number]  . EYE  SURGERY      History reviewed. No pertinent family history.  Social History:  reports that she has quit smoking. She has never used smokeless tobacco. She reports that she drinks alcohol. She reports that she does not use drugs.  Allergies: No Known Allergies  Medications: I have reviewed the patient's current medications.  Results for orders placed or performed during the hospital encounter of 04/23/16 (from the past 48 hour(s))  Lipase, blood     Status: None   Collection Time: 04/23/16  2:05 PM  Result Value Ref Range   Lipase 26 11 - 51 U/L  Comprehensive metabolic panel     Status: Abnormal   Collection Time: 04/23/16  2:05 PM  Result Value Ref Range   Sodium 139 135 - 145 mmol/L   Potassium 3.6 3.5 - 5.1 mmol/L   Chloride 107 101 - 111 mmol/L   CO2 26 22 - 32 mmol/L   Glucose, Bld 111 (H) 65 - 99 mg/dL   BUN 14 6 - 20 mg/dL   Creatinine, Ser 0.82 0.44 - 1.00 mg/dL   Calcium 9.0 8.9 - 10.3 mg/dL   Total Protein 8.2 (H) 6.5 - 8.1 g/dL   Albumin 3.4 (L) 3.5 - 5.0 g/dL   AST 19 15 - 41 U/L   ALT 13 (L) 14 - 54 U/L   Alkaline Phosphatase 53 38 - 126 U/L   Total Bilirubin 0.2 (L) 0.3 - 1.2 mg/dL   GFR calc  non Af Amer >60 >60 mL/min   GFR calc Af Amer >60 >60 mL/min    Comment: (NOTE) The eGFR has been calculated using the CKD EPI equation. This calculation has not been validated in all clinical situations. eGFR's persistently <60 mL/min signify possible Chronic Kidney Disease.    Anion gap 6 5 - 15  CBC     Status: Abnormal   Collection Time: 04/23/16  2:05 PM  Result Value Ref Range   WBC 6.3 3.6 - 11.0 K/uL   RBC 4.73 3.80 - 5.20 MIL/uL   Hemoglobin 12.4 12.0 - 16.0 g/dL   HCT 37.7 35.0 - 47.0 %   MCV 79.7 (L) 80.0 - 100.0 fL   MCH 26.1 26.0 - 34.0 pg   MCHC 32.8 32.0 - 36.0 g/dL   RDW 14.5 11.5 - 14.5 %   Platelets 389 150 - 440 K/uL  Urinalysis, Complete w Microscopic     Status: Abnormal   Collection Time: 04/23/16  2:05 PM  Result Value Ref Range    Color, Urine YELLOW (A) YELLOW   APPearance HAZY (A) CLEAR   Specific Gravity, Urine >1.046 (H) 1.005 - 1.030   pH 5.0 5.0 - 8.0   Glucose, UA NEGATIVE NEGATIVE mg/dL   Hgb urine dipstick NEGATIVE NEGATIVE   Bilirubin Urine NEGATIVE NEGATIVE   Ketones, ur NEGATIVE NEGATIVE mg/dL   Protein, ur NEGATIVE NEGATIVE mg/dL   Nitrite NEGATIVE NEGATIVE   Leukocytes, UA NEGATIVE NEGATIVE   RBC / HPF 6-30 0 - 5 RBC/hpf   WBC, UA NONE SEEN 0 - 5 WBC/hpf   Bacteria, UA NONE SEEN NONE SEEN   Squamous Epithelial / LPF 6-30 (A) NONE SEEN  Basic metabolic panel     Status: Abnormal   Collection Time: 04/24/16  4:14 AM  Result Value Ref Range   Sodium 142 135 - 145 mmol/L   Potassium 3.7 3.5 - 5.1 mmol/L   Chloride 108 101 - 111 mmol/L   CO2 28 22 - 32 mmol/L   Glucose, Bld 102 (H) 65 - 99 mg/dL   BUN 10 6 - 20 mg/dL   Creatinine, Ser 0.66 0.44 - 1.00 mg/dL   Calcium 8.9 8.9 - 10.3 mg/dL   GFR calc non Af Amer >60 >60 mL/min   GFR calc Af Amer >60 >60 mL/min    Comment: (NOTE) The eGFR has been calculated using the CKD EPI equation. This calculation has not been validated in all clinical situations. eGFR's persistently <60 mL/min signify possible Chronic Kidney Disease.    Anion gap 6 5 - 15  CBC     Status: Abnormal   Collection Time: 04/24/16  4:14 AM  Result Value Ref Range   WBC 5.8 3.6 - 11.0 K/uL   RBC 4.43 3.80 - 5.20 MIL/uL   Hemoglobin 11.5 (L) 12.0 - 16.0 g/dL   HCT 35.6 35.0 - 47.0 %   MCV 80.5 80.0 - 100.0 fL   MCH 26.1 26.0 - 34.0 pg   MCHC 32.4 32.0 - 36.0 g/dL   RDW 14.7 (H) 11.5 - 14.5 %   Platelets 352 150 - 440 K/uL    Ct Abdomen Pelvis W Contrast  Result Date: 04/23/2016 CLINICAL DATA:  Nausea, vomiting, constipation and low abdominal pain for 4 weeks. Previous hysterectomy. No evidence of malignancy. EXAM: CT ABDOMEN AND PELVIS WITH CONTRAST TECHNIQUE: Multidetector CT imaging of the abdomen and pelvis was performed using the standard protocol following bolus  administration of intravenous contrast. CONTRAST:  162m  ISOVUE-300 IOPAMIDOL (ISOVUE-300) INJECTION 61% COMPARISON:  CT 04/08/2014. FINDINGS: Lower chest: Probable chronic mucous impacted bronchus at the left lung base (images 6 and 7), stable. No confluent airspace opacity, pleural or pericardial effusion. The heart is mildly enlarged. Hepatobiliary: The liver is normal in density without focal abnormality. No evidence of gallstones, gallbladder wall thickening or biliary dilatation. Pancreas: Unremarkable. No pancreatic ductal dilatation or surrounding inflammatory changes. Spleen: Normal in size without focal abnormality. Adrenals/Urinary Tract: Both adrenal glands appear normal. 19 mm lesion in the upper pole the left kidney has enlarged from the previous study at which time it measured 12 mm. This has a nonspecific density (21 HU on the immediate postcontrast images and 34 HU on the delayed images). No other renal lesions are identified. There is no evidence of urinary tract calculus or hydronephrosis. The bladder appears normal. There is no gas within the bladder lumen. Stomach/Bowel: The stomach, small bowel, appendix and proximal colon demonstrate no significant findings. There are diverticular changes throughout the sigmoid colon with associated wall thickening and inflammation in the surrounding fat. The enteric contrast has not yet passed through the distal colon. There is a 13 x 24 mm fluid collection superior to the sigmoid colon on image 51. Based on the previous distribution of the colon, suggested extraluminal air and fluid on the right on image 59 appears to be due to inflammation of the sigmoid colon itself. No evidence of bowel obstruction or free intraperitoneal air. Vascular/Lymphatic: There are no enlarged abdominal or pelvic lymph nodes. There is aortic and branch vessel atherosclerosis. No acute vascular findings are seen. Reproductive: Hysterectomy. No evidence of adnexal mass. Probable  residual ovarian tissue on the left, similar to previous study. Other: Small amount of pelvic ascites. Musculoskeletal: No acute or significant osseous findings. Extensive degenerative changes throughout the lower lumbar spine with ankylosis across the lower 2 disc spaces. IMPRESSION: 1. Acute sigmoid diverticulosis with pericolonic inflammation and small pericolonic abscess. No easily drainable fluid collection identified at this time. 2. No evidence of bowel obstruction. 3. Indeterminate enlarging left renal lesion, possibly a complex cyst. Atypical neoplasm cannot be excluded by this examination. Follow-up imaging with dedicated renal CT or MRI recommended, ideally after the patient's acute episode has resolved. 4. Diffuse atherosclerosis. 5. These results will be called to the ordering clinician or representative by the Radiology Department at the imaging location. Electronically Signed   By: Richardean Sale M.D.   On: 04/23/2016 10:52    Review of Systems  Constitutional: Negative.   Respiratory: Negative.   Cardiovascular: Negative.   Gastrointestinal: Positive for abdominal pain and constipation. Negative for diarrhea, heartburn, nausea and vomiting.   Blood pressure (!) 144/81, pulse (!) 58, temperature 97.6 F (36.4 C), temperature source Oral, resp. rate 16, height _0  (1.626 m), weight 203 lb 9.6 oz (92.4 kg), SpO2 98 %. Physical Exam  Constitutional: She is oriented to person, place, and time. She appears well-developed and well-nourished.  Eyes: Conjunctivae are normal. No scleral icterus.  Neck: Neck supple.  Cardiovascular: Normal rate, regular rhythm and normal heart sounds.   Respiratory: Effort normal and breath sounds normal.  GI: Soft. Bowel sounds are normal. There is no hepatomegaly. There is tenderness (mild) in the left lower quadrant. There is no rigidity and no guarding. No hernia.  Lymphadenopathy:    She has no cervical adenopathy.  Neurological: She is alert and  oriented to person, place, and time.  Skin: Skin is warm and dry.  Assessment/Plan: Sigmoid colon diverticulitis with a very tiny abscess. Given the fact that the patient is not significantly symptomatic outpatient management with oral antibiotics is appropriate in this setting. She will however need follow-up to ensure that she has no exacerbation or worsening of this problem. Follow-up CT in 4-6 weeks as appropriate. This has been discussed in full with the patient. CT also suggested a developing complex mass in the left kidney likely cystic which may require further evaluation and a GU consultation if required Thank you for allowing me to evaluate and help in the care of this patient Kanai Hilger G 04/24/2016, 10:54 AM

## 2016-04-24 NOTE — Progress Notes (Signed)
Patient discharged to home. IV site removed. Prescription given. Concerns addressed.  

## 2016-04-24 NOTE — Discharge Summary (Signed)
[facility] -  at [facility]   PATIENT NAME: Anne Sharp    MR#:  [id number]  DATE OF BIRTH:  1945-09-06  DATE OF ADMISSION:  04/23/2016 ADMITTING PHYSICIAN: Katharina Caper, MD  DATE OF DISCHARGE: 04/24/2016  PRIMARY CARE PHYSICIAN: Marisue Ivan, MD    ADMISSION DIAGNOSIS:  Diverticulitis of large intestine with abscess without bleeding [K57.20]  DISCHARGE DIAGNOSIS:  Active Problems:   Diverticulitis of large intestine with abscess   Dehydration   Diarrhea   Hyperglycemia   Diverticulitis of large intestine with perforation and abscess left renal lesion  SECONDARY DIAGNOSIS:   Past Medical History:  Diagnosis Date  . Anginal pain (HCC)   . Arthritis   . Bronchitis   . Cat allergies   . Hypertension   . Pain    JAW      POSSIBLE TMJ   RECENT   . Pneumonia   . Seasonal allergies    NASAL CONGESTION/TEARY EYES  . Shortness of breath dyspnea   . Vertigo     HOSPITAL COURSE:   71 year female with history of essential hypertension who presents with abdominal pain. She was seen earlier by GI who had ordered a CT scan which demonstrated diverticulitis with abscess.  1. Acute diverticulitis with abscess: Patient was started on IV ciprofloxacin and Flagyl. She was evaluated by surgery. She is doing well.  Dr Evette Cristal is recommending CT scan follow-up in 4-6 weeks to assure resolution of diverticulitis.  2.Indeterminate enlarging left renal lesion, possibly a complex cyst. Atypical neoplasm cannot be excluded by this examination. Follow-up imaging with dedicated renal CT or MRI recommended, ideally after the patient's acute episode has resolved  3. Essential hypertension: Continue HCTZ 4. UTI: Patient will continue on ciprofloxacin  DISCHARGE CONDITIONS AND DIET:   Stable for discharge on soft diet  CONSULTS OBTAINED:  Treatment Team:  Bluford Kaufmann, MD Seeplaputhur Wynona Luna, MD  DRUG ALLERGIES:  No Known Allergies  DISCHARGE  MEDICATIONS:   Current Discharge Medication List    START taking these medications   Details  ciprofloxacin (CIPRO) 500 MG tablet Take 1 tablet (500 mg total) by mouth 2 (two) times daily. Qty: 26 tablet, Refills: 0    lactobacillus acidophilus & bulgar (LACTINEX) chewable tablet Chew 1 tablet by mouth 3 (three) times daily with meals. Qty: 39 tablet, Refills: 0    metroNIDAZOLE (FLAGYL) 500 MG tablet Take 1 tablet (500 mg total) by mouth 3 (three) times daily. Qty: 39 tablet, Refills: 0      CONTINUE these medications which have NOT CHANGED   Details  fluticasone (FLONASE) 50 MCG/ACT nasal spray Place 1 spray into both nostrils daily.    hydrochlorothiazide (MICROZIDE) 12.5 MG capsule Take 12.5 mg by mouth daily.    loratadine (CLARITIN) 10 MG tablet Take 10 mg by mouth daily.       STOP taking these medications     cyclobenzaprine (FLEXERIL) 10 MG tablet      Olopatadine HCl (PAZEO) 0.7 % SOLN           Today   CHIEF COMPLAINT:   Doing fairly well this morning. She is complaining of dysuria   VITAL SIGNS:  Blood pressure (!) 144/81, pulse (!) 58, temperature 97.6 F (36.4 C), temperature source Oral, resp. rate 16, height  (1.626 m), weight 92.4 kg (203 lb 9.6 oz), SpO2 98 %.   REVIEW OF SYSTEMS:  Review of Systems  Constitutional: Negative.  Negative for chills, fever and malaise/fatigue.  HENT: Negative.  Negative for ear discharge, ear pain, hearing loss, nosebleeds and sore throat.   Eyes: Negative.  Negative for blurred vision and pain.  Respiratory: Negative.  Negative for cough, hemoptysis, shortness of breath and wheezing.   Cardiovascular: Negative.  Negative for chest pain, palpitations and leg swelling.  Gastrointestinal: Negative.  Negative for abdominal pain, blood in stool, diarrhea, nausea and vomiting.  Genitourinary: Positive for dysuria.  Musculoskeletal: Negative.  Negative for back pain.  Skin: Negative.   Neurological: Negative  for dizziness, tremors, speech change, focal weakness, seizures and headaches.  Endo/Heme/Allergies: Negative.  Does not bruise/bleed easily.  Psychiatric/Behavioral: Negative.  Negative for depression, hallucinations and suicidal ideas.     PHYSICAL EXAMINATION:  GENERAL:  71 y.o.-year-old patient lying in the bed with no acute distress.  NECK:  Supple, no jugular venous distention. No thyroid enlargement, no tenderness.  LUNGS: Normal breath sounds bilaterally, no wheezing, rales,rhonchi  No use of accessory muscles of respiration.  CARDIOVASCULAR: S1, S2 normal. No murmurs, rubs, or gallops.  ABDOMEN: Soft, non-tender, non-distended. Bowel sounds present. No organomegaly or mass.  EXTREMITIES: No pedal edema, cyanosis, or clubbing.  PSYCHIATRIC: The patient is alert and oriented x 3.  SKIN: No obvious rash, lesion, or ulcer.   DATA REVIEW:   CBC  Recent Labs Lab 04/24/16 0414  WBC 5.8  HGB 11.5*  HCT 35.6  PLT 352    Chemistries   Recent Labs Lab 04/23/16 1405 04/24/16 0414  NA 139 142  K 3.6 3.7  CL 107 108  CO2 26 28  GLUCOSE 111* 102*  BUN 14 10  CREATININE 0.82 0.66  CALCIUM 9.0 8.9  AST 19  --   ALT 13*  --   ALKPHOS 53  --   BILITOT 0.2*  --     Cardiac Enzymes No results for input(s): TROPONINI in the last 168 hours.  Microbiology Results  @  RADIOLOGY:  Ct Abdomen Pelvis W Contrast  Result Date: 04/23/2016 CLINICAL DATA:  Nausea, vomiting, constipation and low abdominal pain for 4 weeks. Previous hysterectomy. No evidence of malignancy. EXAM: CT ABDOMEN AND PELVIS WITH CONTRAST TECHNIQUE: Multidetector CT imaging of the abdomen and pelvis was performed using the standard protocol following bolus administration of intravenous contrast. CONTRAST:  ISOVUE-300 IOPAMIDOL (ISOVUE-300) INJECTION 61% COMPARISON:  CT 04/08/2014. FINDINGS: Lower chest: Probable chronic mucous impacted bronchus at the left lung base (images 6 and 7), stable.  No confluent airspace opacity, pleural or pericardial effusion. The heart is mildly enlarged. Hepatobiliary: The liver is normal in density without focal abnormality. No evidence of gallstones, gallbladder wall thickening or biliary dilatation. Pancreas: Unremarkable. No pancreatic ductal dilatation or surrounding inflammatory changes. Spleen: Normal in size without focal abnormality. Adrenals/Urinary Tract: Both adrenal glands appear normal. 19 mm lesion in the upper pole the left kidney has enlarged from the previous study at which time it measured 12 mm. This has a nonspecific density (21 HU on the immediate postcontrast images and 34 HU on the delayed images). No other renal lesions are identified. There is no evidence of urinary tract calculus or hydronephrosis. The bladder appears normal. There is no gas within the bladder lumen. Stomach/Bowel: The stomach, small bowel, appendix and proximal colon demonstrate no significant findings. There are diverticular changes throughout the sigmoid colon with associated wall thickening and inflammation in the surrounding fat. The enteric contrast has not yet passed through the distal colon. There is a 13 x 24 mm fluid collection superior to the  sigmoid colon on image 51. Based on the previous distribution of the colon, suggested extraluminal air and fluid on the right on image 59 appears to be due to inflammation of the sigmoid colon itself. No evidence of bowel obstruction or free intraperitoneal air. Vascular/Lymphatic: There are no enlarged abdominal or pelvic lymph nodes. There is aortic and branch vessel atherosclerosis. No acute vascular findings are seen. Reproductive: Hysterectomy. No evidence of adnexal mass. Probable residual ovarian tissue on the left, similar to previous study. Other: Small amount of pelvic ascites. Musculoskeletal: No acute or significant osseous findings. Extensive degenerative changes throughout the lower lumbar spine with ankylosis across  the lower 2 disc spaces. IMPRESSION: 1. Acute sigmoid diverticulosis with pericolonic inflammation and small pericolonic abscess. No easily drainable fluid collection identified at this time. 2. No evidence of bowel obstruction. 3. Indeterminate enlarging left renal lesion, possibly a complex cyst. Atypical neoplasm cannot be excluded by this examination. Follow-up imaging with dedicated renal CT or MRI recommended, ideally after the patient's acute episode has resolved. 4. Diffuse atherosclerosis. 5. These results will be called to the ordering clinician or representative by the Radiology Department at the imaging location. Electronically Signed   By: Carey Bullocks M.D.   On: 04/23/2016 10:52      Current Discharge Medication List    START taking these medications   Details  ciprofloxacin (CIPRO) 500 MG tablet Take 1 tablet (500 mg total) by mouth 2 (two) times daily. Qty: 26 tablet, Refills: 0    lactobacillus acidophilus & bulgar (LACTINEX) chewable tablet Chew 1 tablet by mouth 3 (three) times daily with meals. Qty: 39 tablet, Refills: 0    metroNIDAZOLE (FLAGYL) 500 MG tablet Take 1 tablet (500 mg total) by mouth 3 (three) times daily. Qty: 39 tablet, Refills: 0      CONTINUE these medications which have NOT CHANGED   Details  fluticasone (FLONASE) 50 MCG/ACT nasal spray Place 1 spray into both nostrils daily.    hydrochlorothiazide (MICROZIDE) 12.5 MG capsule Take 12.5 mg by mouth daily.    loratadine (CLARITIN) 10 MG tablet Take 10 mg by mouth daily.       STOP taking these medications     cyclobenzaprine (FLEXERIL) 10 MG tablet      Olopatadine HCl (PAZEO) 0.7 % SOLN          Management plans discussed with the patient and she is in agreement. Stable for discharge home  Patient should follow up with dr sankar 2 weeks pcp   CODE STATUS:     Code Status Orders        Start     Ordered   04/23/16 1856  Full code  Continuous     04/23/16 1855    Code Status  History    Date Active Date Inactive Code Status Order ID Comments User Context   This patient has a current code status but no historical code status.    Advance Directive Documentation     Most Recent Value  Type of Advance Directive  Living will  Pre-existing out of facility DNR order (yellow form or pink MOST form)  -  "MOST" Form in Place?  -      TOTAL TIME TAKING CARE OF THIS PATIENT: 37 minutes.    Note: This dictation was prepared with Dragon dictation along with smaller phrase technology. Any transcriptional errors that result from this process are unintentional.  Joua Bake M.D on 04/24/2016 at 10:52 AM  Between 7am to 6pm -  Pager - [phone] After 6pm go to [email or web address] - Social research officer, government  [facility]  Office  [phone]  CC: Primary care physician; Marisue Ivan, MD

## 2016-04-25 LAB — HEMOGLOBIN A1C
Hgb A1c MFr Bld: 5.9 % — ABNORMAL HIGH (ref 4.8–5.6)
MEAN PLASMA GLUCOSE: 123 mg/dL

## 2016-04-26 ENCOUNTER — Telehealth: Payer: Self-pay | Admitting: General Surgery

## 2016-04-26 ENCOUNTER — Other Ambulatory Visit: Payer: Self-pay | Admitting: Gastroenterology

## 2016-04-26 DIAGNOSIS — R1032 Left lower quadrant pain: Secondary | ICD-10-CM

## 2016-04-26 DIAGNOSIS — R197 Diarrhea, unspecified: Secondary | ICD-10-CM

## 2016-04-26 LAB — GLUCOSE, CAPILLARY: GLUCOSE-CAPILLARY: 92 mg/dL (ref 65–99)

## 2016-04-26 NOTE — Telephone Encounter (Signed)
I SPOKE TO PATIENT TO HAVE HER CALL TO [facility] TO SCHEDULE HER F/U APPT FROM THE WEEKEND. SHE STATED THAT SHE WOULDN'T BE MAKING THE APPOINTMENT THAT SHE HAD AN APPOINTMENT WITH [facility] AND WOULD SEE THEM.

## 2016-04-29 LAB — OVA + PARASITE EXAM

## 2016-04-29 LAB — O&P RESULT

## 2016-05-12 ENCOUNTER — Other Ambulatory Visit: Payer: Self-pay | Admitting: Gastroenterology

## 2016-05-12 DIAGNOSIS — N281 Cyst of kidney, acquired: Secondary | ICD-10-CM

## 2016-05-21 ENCOUNTER — Ambulatory Visit
Admission: RE | Admit: 2016-05-21 | Discharge: 2016-05-21 | Disposition: A | Discharge status: Home / Self Care | Payer: Medicare Other | Source: Ambulatory Visit | Attending: Gastroenterology | Admitting: Gastroenterology

## 2016-05-21 DIAGNOSIS — N281 Cyst of kidney, acquired: Secondary | ICD-10-CM | POA: Diagnosis present

## 2016-05-21 DIAGNOSIS — K5732 Diverticulitis of large intestine without perforation or abscess without bleeding: Secondary | ICD-10-CM | POA: Diagnosis not present

## 2016-05-21 DIAGNOSIS — M5136 Other intervertebral disc degeneration, lumbar region: Secondary | ICD-10-CM | POA: Insufficient documentation

## 2016-05-21 DIAGNOSIS — M5134 Other intervertebral disc degeneration, thoracic region: Secondary | ICD-10-CM | POA: Diagnosis not present

## 2016-05-21 MED ORDER — GADOBENATE DIMEGLUMINE 529 MG/ML IV SOLN
19.0000 mL | Freq: Once | INTRAVENOUS | Status: AC | PRN
  Administered 2016-05-21: 19 mL via INTRAVENOUS

## 2016-08-30 ENCOUNTER — Encounter: Payer: Self-pay | Admitting: *Deleted

## 2016-08-31 ENCOUNTER — Encounter: Payer: Self-pay | Admitting: *Deleted

## 2016-08-31 ENCOUNTER — Encounter
Admission: RE | Disposition: A | Discharge status: Home / Self Care | Payer: Self-pay | Source: Ambulatory Visit | Attending: Gastroenterology

## 2016-08-31 ENCOUNTER — Ambulatory Visit: Payer: Medicare Other | Admitting: Anesthesiology

## 2016-08-31 ENCOUNTER — Ambulatory Visit
Admission: RE | Admit: 2016-08-31 | Discharge: 2016-08-31 | Disposition: A | Discharge status: Home / Self Care | Payer: Medicare Other | Source: Ambulatory Visit | Attending: Gastroenterology | Admitting: Gastroenterology

## 2016-08-31 DIAGNOSIS — Z6836 Body mass index (BMI) 36.0-36.9, adult: Secondary | ICD-10-CM | POA: Insufficient documentation

## 2016-08-31 DIAGNOSIS — K621 Rectal polyp: Secondary | ICD-10-CM | POA: Insufficient documentation

## 2016-08-31 DIAGNOSIS — Z7982 Long term (current) use of aspirin: Secondary | ICD-10-CM | POA: Insufficient documentation

## 2016-08-31 DIAGNOSIS — Z79899 Other long term (current) drug therapy: Secondary | ICD-10-CM | POA: Diagnosis not present

## 2016-08-31 DIAGNOSIS — Z8719 Personal history of other diseases of the digestive system: Secondary | ICD-10-CM | POA: Insufficient documentation

## 2016-08-31 DIAGNOSIS — Z87891 Personal history of nicotine dependence: Secondary | ICD-10-CM | POA: Diagnosis not present

## 2016-08-31 DIAGNOSIS — I10 Essential (primary) hypertension: Secondary | ICD-10-CM | POA: Diagnosis not present

## 2016-08-31 DIAGNOSIS — K5730 Diverticulosis of large intestine without perforation or abscess without bleeding: Secondary | ICD-10-CM | POA: Insufficient documentation

## 2016-08-31 DIAGNOSIS — M1990 Unspecified osteoarthritis, unspecified site: Secondary | ICD-10-CM | POA: Insufficient documentation

## 2016-08-31 DIAGNOSIS — Q438 Other specified congenital malformations of intestine: Secondary | ICD-10-CM | POA: Diagnosis not present

## 2016-08-31 DIAGNOSIS — K5792 Diverticulitis of intestine, part unspecified, without perforation or abscess without bleeding: Secondary | ICD-10-CM | POA: Diagnosis present

## 2016-08-31 HISTORY — PX: COLONOSCOPY WITH PROPOFOL: SHX5780

## 2016-08-31 SURGERY — COLONOSCOPY WITH PROPOFOL
Anesthesia: General

## 2016-08-31 MED ORDER — SODIUM CHLORIDE 0.9 % IV SOLN: INTRAVENOUS | Status: DC

## 2016-08-31 MED ORDER — PROPOFOL 10 MG/ML IV BOLUS
INTRAVENOUS | Status: DC | PRN
  Administered 2016-08-31: 30 mg via INTRAVENOUS
  Administered 2016-08-31: 70 mg via INTRAVENOUS

## 2016-08-31 MED ORDER — LIDOCAINE 2% (20 MG/ML) 5 ML SYRINGE
INTRAMUSCULAR | Status: DC | PRN
  Administered 2016-08-31: 50 mg via INTRAVENOUS

## 2016-08-31 MED ORDER — PROPOFOL 500 MG/50ML IV EMUL
INTRAVENOUS | Status: DC | PRN
  Administered 2016-08-31: 180 ug/kg/min via INTRAVENOUS

## 2016-08-31 MED ORDER — PROPOFOL 500 MG/50ML IV EMUL
INTRAVENOUS | Status: AC
  Filled 2016-08-31: qty 50

## 2016-08-31 MED ORDER — SODIUM CHLORIDE 0.9 % IV SOLN
INTRAVENOUS | Status: DC
  Administered 2016-08-31: 13:00:00 via INTRAVENOUS

## 2016-08-31 NOTE — Op Note (Signed)
[facility] Gastroenterology Patient Name: Anne Sharp Procedure Date: 08/31/2016 1:19 PM MRN: [id number] Account #: [id number] Date of Birth: April 12, 1945 Admit Type: Outpatient Age: 71 Room: [facility] [place] ENDO ROOM 3 Gender: Female Note Status: Finalized Procedure:            Colonoscopy Indications:          Follow-up of diverticulitis Providers:            Christena Deem, MD Referring MD:         Marisue Ivan (Referring MD) Medicines:            Monitored Anesthesia Care Complications:        No immediate complications. Procedure:            Pre-Anesthesia Assessment:                       - ASA Grade Assessment: III - A patient with severe                        systemic disease.                       After obtaining informed consent, the colonoscope was                        passed under direct vision. Throughout the procedure,                        the patient's blood pressure, pulse, and oxygen                        saturations were monitored continuously. The                        Colonoscope was introduced through the anus and                        advanced to the the cecum, identified by appendiceal                        orifice and ileocecal valve. The colonoscopy was                        unusually difficult due to multiple diverticula in the                        colon, poor bowel prep with stool present and a                        tortuous colon. Successful completion of the procedure                        was aided by changing the patient to a supine position,                        changing the patient to a prone position, using manual                        pressure and lavage. The patient tolerated the  procedure well. The quality of the bowel preparation                        was fair. Findings:      Two sessile polyps were found in the rectum. The polyps were 1 to 2 mm       in size. These polyps were removed  with a cold biopsy forceps. Resection       and retrieval were complete.      Many small and large-mouthed diverticula were found in the sigmoid       colon, descending colon and distal transverse colon.      The digital rectal exam was normal. Impression:           - Preparation of the colon was fair.                       - Two 1 to 2 mm polyps in the rectum, removed with a                        cold biopsy forceps. Resected and retrieved.                       - Diverticulosis in the sigmoid colon, in the                        descending colon and in the distal transverse colon. Recommendation:       - Discharge patient to home.                       - Await pathology results.                       - Telephone GI clinic for pathology results in 1 week. Procedure Code(s):    --- Professional ---                       [phone], Colonoscopy, flexible; with biopsy, single or                        multiple Diagnosis Code(s):    --- Professional ---                       K62.1, Rectal polyp                       K57.32, Diverticulitis of large intestine without                        perforation or abscess without bleeding                       K57.30, Diverticulosis of large intestine without                        perforation or abscess without bleeding CPT copyright 2016 American Medical Association. All rights reserved. The codes documented in this report are preliminary and upon coder review may  be revised to meet current compliance requirements. Christena DeemMartin U Jacson Rapaport, MD 08/31/2016 2:13:29 PM This report has been signed electronically. Number of Addenda: 0 Note Initiated On: 08/31/2016 1:19 PM Scope Withdrawal Time: 0 hours 7  minutes 13 seconds  Total Procedure Duration: 0 hours 34 minutes 54 seconds       [facility]

## 2016-08-31 NOTE — Transfer of Care (Signed)
Immediate Anesthesia Transfer of Care Note  Patient: Anne Sharp  Procedure(s) Performed: Procedure(s): COLONOSCOPY WITH PROPOFOL (N/A)  Patient Location: Endoscopy Unit  Anesthesia Type:General  Level of Consciousness: sedated  Airway & Oxygen Therapy: Patient connected to nasal cannula oxygen  Post-op Assessment: Post -op Vital signs reviewed and stable  Post vital signs: stable  Last Vitals:  Vitals:   08/31/16 1250 08/31/16 1417  BP: (!) 142/88 (!) 95/59  Pulse: (!) 106 77  Resp: (!) 22   Temp: (!) 36.3 C (!) 35.8 C    Last Pain:  Vitals:   08/31/16 1417  TempSrc: Tympanic  PainSc:       Patients Stated Pain Goal: 8 (08/31/16 1250)  Complications: No apparent anesthesia complications

## 2016-08-31 NOTE — Anesthesia Preprocedure Evaluation (Signed)
Anesthesia Evaluation  Patient identified by MRN, date of birth, ID band Patient awake    Reviewed: Allergy & Precautions, NPO status , Patient's Chart, lab work & pertinent test results  Airway Mallampati: III       Dental  (+) Teeth Intact   Pulmonary shortness of breath, former smoker,     + decreased breath sounds      Cardiovascular Exercise Tolerance: Good hypertension, Pt. on medications  Rhythm:Regular Rate:Normal     Neuro/Psych negative neurological ROS  negative psych ROS   GI/Hepatic negative GI ROS, Neg liver ROS,   Endo/Other  Morbid obesity  Renal/GU negative Renal ROS     Musculoskeletal   Abdominal (+) + obese,   Peds negative pediatric ROS (+)  Hematology negative hematology ROS (+)   Anesthesia Other Findings   Reproductive/Obstetrics                             Anesthesia Physical Anesthesia Plan  ASA: III  Anesthesia Plan: General   Post-op Pain Management:    Induction: Intravenous  PONV Risk Score and Plan:   Airway Management Planned: Natural Airway and Nasal Cannula  Additional Equipment:   Intra-op Plan:   Post-operative Plan:   Informed Consent: I have reviewed the patients History and Physical, chart, labs and discussed the procedure including the risks, benefits and alternatives for the proposed anesthesia with the patient or authorized representative who has indicated his/her understanding and acceptance.     Plan Discussed with: CRNA  Anesthesia Plan Comments:         Anesthesia Quick Evaluation

## 2016-08-31 NOTE — H&P (Signed)
Outpatient short stay form Pre-procedure 08/31/2016 1:16 PM Anne Sharp U Johntay Doolen MD  Primary Physician: Dr Marisue IvanKanhka Linthavong  Reason for visit:  Colonoscopy  History of present illness:  Patient is a 71 year old female presenting today as above. She has personal history of an episode of diverticulitis several months ago that was treated for antibiotics. He states that this is pretty much resolved. She has had a colonoscopy in the past but apparently had no findings except diverticulosis on that procedure. She is presenting today for evaluation this regard. She tolerated her prep well. She takes 81 mg aspirin but no other aspirin products or blood thinning agents.    Current Facility-Administered Medications:  .  0.9 %  sodium chloride infusion, , Intravenous, Continuous, Anne DeemSkulskie, Isabella Roemmich U, MD, Last Rate: 20 mL/hr at 08/31/16 1306 .  0.9 %  sodium chloride infusion, , Intravenous, Continuous, Anne DeemSkulskie, [name], MD  Prescriptions Prior to Admission  Medication Sig Dispense Refill Last Dose  . aspirin EC 81 MG tablet Take 81 mg by mouth daily.   Past Week at Unknown time  . cefdinir (OMNICEF) 300 MG capsule Take 300 mg by mouth 2 (two) times daily.   08/30/2016  . fluticasone (FLONASE) 50 MCG/ACT nasal spray Place 1 spray into both nostrils daily.   Past Week at Unknown time  . hydrochlorothiazide (MICROZIDE) 12.5 MG capsule Take 12.5 mg by mouth daily.   08/31/2016 at 0730  . loratadine (CLARITIN) 10 MG tablet Take 10 mg by mouth daily.    Past Week at Unknown time  . lovastatin (MEVACOR) 20 MG tablet Take 20 mg by mouth at bedtime.   Past Week at Unknown time     No Known Allergies   Past Medical History:  Diagnosis Date  . Anginal pain (HCC)   . Arthritis   . Bronchitis   . Cat allergies   . Hypertension   . Pain    JAW      POSSIBLE TMJ   RECENT   . Pneumonia   . Seasonal allergies    NASAL CONGESTION/TEARY EYES  . Shortness of breath dyspnea   . Vertigo     Review of systems:       Physical Exam    Heart and lungs: Regular rate and rhythm without rub or gallop, lungs are bilaterally clear.    HEENT: Normocephalic atraumatic eyes are anicteric    Other:     Pertinant exam for procedure: Soft nontender nondistended bowel sounds positive normoactive.    Planned proceedures: Colonoscopy and indicated procedures. I have discussed the risks benefits and complications of procedures to include not limited to bleeding, infection, perforation and the risk of sedation and the patient wishes to proceed.    Anne Sharp U Kadia Abaya, MD Gastroenterology 08/31/2016  1:16 PM

## 2016-08-31 NOTE — Anesthesia Post-op Follow-up Note (Signed)
Anesthesia QCDR form completed.        

## 2016-09-01 ENCOUNTER — Encounter: Payer: Self-pay | Admitting: Gastroenterology

## 2016-09-01 NOTE — OR Nursing (Signed)
Pt c/o sore shoulder. Apparently this is a ongoing isusse and was made worst per patient by the procedure yesterday..Marland Kitchen

## 2016-09-01 NOTE — Anesthesia Postprocedure Evaluation (Signed)
Anesthesia Post Note  Patient: Anne Sharp  Procedure(s) Performed: Procedure(s) (LRB): COLONOSCOPY WITH PROPOFOL (N/A)  Patient location during evaluation: PACU Anesthesia Type: General Level of consciousness: awake Pain management: pain level controlled Vital Signs Assessment: post-procedure vital signs reviewed and stable Respiratory status: spontaneous breathing Cardiovascular status: stable Anesthetic complications: no     Last Vitals:  Vitals:   08/31/16 1437 08/31/16 1447  BP: (!) 141/83 (!) 153/91  Pulse: 67 63  Resp: 14 19  Temp:      Last Pain:  Vitals:   08/31/16 1417  TempSrc: Tympanic  PainSc:                  VAN STAVEREN,Oleva Koo

## 2016-09-02 ENCOUNTER — Other Ambulatory Visit: Payer: Self-pay | Admitting: Family Medicine

## 2016-09-02 DIAGNOSIS — M25511 Pain in right shoulder: Secondary | ICD-10-CM

## 2016-09-02 DIAGNOSIS — M542 Cervicalgia: Secondary | ICD-10-CM

## 2016-09-02 LAB — SURGICAL PATHOLOGY

## 2016-09-17 ENCOUNTER — Ambulatory Visit: Payer: Medicare Other

## 2016-09-28 ENCOUNTER — Ambulatory Visit
Admission: RE | Admit: 2016-09-28 | Discharge: 2016-09-28 | Disposition: A | Discharge status: Home / Self Care | Payer: Medicare Other | Source: Ambulatory Visit | Attending: Family Medicine | Admitting: Family Medicine

## 2016-09-28 DIAGNOSIS — M4802 Spinal stenosis, cervical region: Secondary | ICD-10-CM | POA: Diagnosis not present

## 2016-09-28 DIAGNOSIS — M25511 Pain in right shoulder: Secondary | ICD-10-CM | POA: Diagnosis not present

## 2016-09-28 DIAGNOSIS — M50221 Other cervical disc displacement at C4-C5 level: Secondary | ICD-10-CM | POA: Diagnosis not present

## 2016-09-28 DIAGNOSIS — M542 Cervicalgia: Secondary | ICD-10-CM

## 2016-12-23 ENCOUNTER — Other Ambulatory Visit: Payer: Self-pay | Admitting: Family Medicine

## 2016-12-23 DIAGNOSIS — Z1231 Encounter for screening mammogram for malignant neoplasm of breast: Secondary | ICD-10-CM

## 2016-12-28 ENCOUNTER — Ambulatory Visit
Admission: RE | Admit: 2016-12-28 | Discharge: 2016-12-28 | Disposition: A | Discharge status: Home / Self Care | Payer: Medicare Other | Source: Ambulatory Visit | Attending: Family Medicine | Admitting: Family Medicine

## 2016-12-28 DIAGNOSIS — Z1231 Encounter for screening mammogram for malignant neoplasm of breast: Secondary | ICD-10-CM | POA: Diagnosis not present

## 2016-12-29 ENCOUNTER — Inpatient Hospital Stay
Admission: RE | Admit: 2016-12-29 | Discharge: 2016-12-29 | Disposition: A | Discharge status: Home / Self Care | Payer: Self-pay | Source: Ambulatory Visit | Attending: *Deleted | Admitting: *Deleted

## 2016-12-29 ENCOUNTER — Other Ambulatory Visit: Payer: Self-pay | Admitting: *Deleted

## 2016-12-29 DIAGNOSIS — Z9289 Personal history of other medical treatment: Secondary | ICD-10-CM

## 2017-08-30 ENCOUNTER — Other Ambulatory Visit: Payer: Self-pay | Admitting: Gastroenterology

## 2017-08-30 DIAGNOSIS — R1032 Left lower quadrant pain: Secondary | ICD-10-CM

## 2017-08-31 ENCOUNTER — Ambulatory Visit
Admission: RE | Admit: 2017-08-31 | Discharge: 2017-08-31 | Disposition: A | Discharge status: Home / Self Care | Payer: Medicare Other | Source: Ambulatory Visit | Attending: Gastroenterology | Admitting: Gastroenterology

## 2017-08-31 ENCOUNTER — Other Ambulatory Visit
Admission: RE | Admit: 2017-08-31 | Discharge: 2017-08-31 | Disposition: A | Discharge status: Home / Self Care | Payer: Medicare Other | Source: Ambulatory Visit | Attending: Gastroenterology | Admitting: Gastroenterology

## 2017-08-31 DIAGNOSIS — R197 Diarrhea, unspecified: Secondary | ICD-10-CM | POA: Diagnosis present

## 2017-08-31 DIAGNOSIS — K5790 Diverticulosis of intestine, part unspecified, without perforation or abscess without bleeding: Secondary | ICD-10-CM | POA: Diagnosis not present

## 2017-08-31 DIAGNOSIS — N281 Cyst of kidney, acquired: Secondary | ICD-10-CM | POA: Insufficient documentation

## 2017-08-31 DIAGNOSIS — R1032 Left lower quadrant pain: Secondary | ICD-10-CM

## 2017-08-31 DIAGNOSIS — R509 Fever, unspecified: Secondary | ICD-10-CM | POA: Insufficient documentation

## 2017-08-31 DIAGNOSIS — R1030 Lower abdominal pain, unspecified: Secondary | ICD-10-CM | POA: Insufficient documentation

## 2017-08-31 LAB — GASTROINTESTINAL PANEL BY PCR, STOOL (REPLACES STOOL CULTURE)
ADENOVIRUS F40/41: NOT DETECTED
Astrovirus: NOT DETECTED
CRYPTOSPORIDIUM: NOT DETECTED
CYCLOSPORA CAYETANENSIS: NOT DETECTED
Campylobacter species: NOT DETECTED
Entamoeba histolytica: NOT DETECTED
Enteroaggregative E coli (EAEC): DETECTED — AB
Enteropathogenic E coli (EPEC): DETECTED — AB
Enterotoxigenic E coli (ETEC): NOT DETECTED
GIARDIA LAMBLIA: NOT DETECTED
Norovirus GI/GII: NOT DETECTED
Plesimonas shigelloides: NOT DETECTED
Rotavirus A: NOT DETECTED
SALMONELLA SPECIES: NOT DETECTED
SHIGELLA/ENTEROINVASIVE E COLI (EIEC): NOT DETECTED
Sapovirus (I, II, IV, and V): NOT DETECTED
Shiga like toxin producing E coli (STEC): NOT DETECTED
Vibrio cholerae: NOT DETECTED
Vibrio species: NOT DETECTED
Yersinia enterocolitica: NOT DETECTED

## 2017-08-31 LAB — C DIFFICILE QUICK SCREEN W PCR REFLEX
C DIFFICILE (CDIFF) INTERP: NOT DETECTED
C DIFFICLE (CDIFF) ANTIGEN: NEGATIVE
C Diff toxin: NEGATIVE

## 2017-08-31 MED ORDER — IOHEXOL 300 MG/ML  SOLN
100.0000 mL | Freq: Once | INTRAMUSCULAR | Status: AC | PRN
  Administered 2017-08-31: 100 mL via INTRAVENOUS

## 2018-08-03 ENCOUNTER — Other Ambulatory Visit: Payer: Self-pay | Admitting: Family Medicine

## 2018-08-03 DIAGNOSIS — Z1231 Encounter for screening mammogram for malignant neoplasm of breast: Secondary | ICD-10-CM

## 2018-09-07 ENCOUNTER — Ambulatory Visit
Admission: RE | Admit: 2018-09-07 | Discharge: 2018-09-07 | Disposition: A | Discharge status: Home / Self Care | Payer: Medicare Other | Source: Ambulatory Visit | Attending: Family Medicine | Admitting: Family Medicine

## 2018-09-07 DIAGNOSIS — Z1231 Encounter for screening mammogram for malignant neoplasm of breast: Secondary | ICD-10-CM | POA: Insufficient documentation

## 2019-08-02 ENCOUNTER — Ambulatory Visit: Payer: Medicare Other

## 2019-08-04 ENCOUNTER — Ambulatory Visit: Payer: Medicare Other | Attending: Internal Medicine

## 2019-08-04 DIAGNOSIS — Z23 Encounter for immunization: Secondary | ICD-10-CM

## 2019-08-04 NOTE — Progress Notes (Signed)
   Covid-19 Vaccination Clinic  Name:  Armelia Penton    MRN: [id number] DOB: [birth year]  08/04/2019  Ms. Erich was observed post Covid-19 immunization for 15 minutes without incident. She was provided with Vaccine Information Sheet and instruction to access the V-Safe system.   Ms. Vandenberghe was instructed to call 911 with any severe reactions post vaccine: Marland Kitchen Difficulty breathing  . Swelling of face and throat  . A fast heartbeat  . A bad rash all over body  . Dizziness and weakness   Immunizations Administered    Name Date Dose VIS Date Route   JANSSEN COVID-19 VACCINE 08/04/2019 11:04 AM 0.5 mL 03/24/2019 Intramuscular   Manufacturer: [employer]   Lot: [id number]   NDC: 44315-400-86

## 2019-10-29 ENCOUNTER — Other Ambulatory Visit: Payer: Self-pay | Admitting: Family Medicine

## 2019-10-29 DIAGNOSIS — Z1231 Encounter for screening mammogram for malignant neoplasm of breast: Secondary | ICD-10-CM

## 2019-11-20 ENCOUNTER — Ambulatory Visit
Admission: RE | Admit: 2019-11-20 | Discharge: 2019-11-20 | Disposition: A | Discharge status: Home / Self Care | Payer: Medicare Other | Source: Ambulatory Visit | Attending: Family Medicine | Admitting: Family Medicine

## 2019-11-20 ENCOUNTER — Other Ambulatory Visit: Payer: Self-pay

## 2019-11-20 DIAGNOSIS — Z1231 Encounter for screening mammogram for malignant neoplasm of breast: Secondary | ICD-10-CM | POA: Insufficient documentation

## 2020-03-13 ENCOUNTER — Other Ambulatory Visit: Payer: Self-pay

## 2020-03-13 ENCOUNTER — Emergency Department: Payer: Medicare Other

## 2020-03-13 ENCOUNTER — Emergency Department
Admission: EM | Admit: 2020-03-13 | Discharge: 2020-03-13 | Disposition: A | Discharge status: Home / Self Care | Payer: Medicare Other | Attending: Emergency Medicine | Admitting: Emergency Medicine

## 2020-03-13 DIAGNOSIS — I10 Essential (primary) hypertension: Secondary | ICD-10-CM | POA: Insufficient documentation

## 2020-03-13 DIAGNOSIS — R1032 Left lower quadrant pain: Secondary | ICD-10-CM | POA: Diagnosis present

## 2020-03-13 DIAGNOSIS — Z7982 Long term (current) use of aspirin: Secondary | ICD-10-CM | POA: Insufficient documentation

## 2020-03-13 DIAGNOSIS — Z79899 Other long term (current) drug therapy: Secondary | ICD-10-CM | POA: Insufficient documentation

## 2020-03-13 DIAGNOSIS — Z87891 Personal history of nicotine dependence: Secondary | ICD-10-CM | POA: Insufficient documentation

## 2020-03-13 LAB — COMPREHENSIVE METABOLIC PANEL
ALT: 14 U/L (ref 0–44)
AST: 17 U/L (ref 15–41)
Albumin: 3.5 g/dL (ref 3.5–5.0)
Alkaline Phosphatase: 52 U/L (ref 38–126)
Anion gap: 10 (ref 5–15)
BUN: 15 mg/dL (ref 8–23)
CO2: 28 mmol/L (ref 22–32)
Calcium: 9.1 mg/dL (ref 8.9–10.3)
Chloride: 102 mmol/L (ref 98–111)
Creatinine, Ser: 0.91 mg/dL (ref 0.44–1.00)
GFR, Estimated: >60 mL/min (ref >60)
Glucose, Bld: 112 mg/dL — ABNORMAL HIGH (ref 70–99)
Potassium: 3.6 mmol/L (ref 3.5–5.1)
Sodium: 140 mmol/L (ref 135–145)
Total Bilirubin: 0.4 mg/dL (ref 0.3–1.2)
Total Protein: 7.9 g/dL (ref 6.5–8.1)

## 2020-03-13 LAB — CBC
HCT: 39.5 % (ref 36.0–46.0)
Hemoglobin: 12.1 g/dL (ref 12.0–15.0)
MCH: 25.3 pg — ABNORMAL LOW (ref 26.0–34.0)
MCHC: 30.6 g/dL (ref 30.0–36.0)
MCV: 82.6 fL (ref 80.0–100.0)
Platelets: 358 10*3/uL (ref 150–400)
RBC: 4.78 MIL/uL (ref 3.87–5.11)
RDW: 13.9 % (ref 11.5–15.5)
WBC: 7.4 10*3/uL (ref 4.0–10.5)
nRBC: 0 % (ref 0.0–0.2)

## 2020-03-13 LAB — LIPASE, BLOOD: Lipase: 30 U/L (ref 11–51)

## 2020-03-13 LAB — TROPONIN I (HIGH SENSITIVITY): Troponin I (High Sensitivity): 4 ng/L (ref <18)

## 2020-03-13 MED ORDER — IOHEXOL 300 MG/ML  SOLN
100.0000 mL | Freq: Once | INTRAMUSCULAR | Status: AC | PRN
  Administered 2020-03-13: 100 mL via INTRAVENOUS

## 2020-03-13 MED ORDER — CIPROFLOXACIN HCL 500 MG PO TABS
500.0000 mg | ORAL_TABLET | Freq: Once | ORAL | Status: AC
  Administered 2020-03-13: 500 mg via ORAL
  Filled 2020-03-13: qty 1

## 2020-03-13 MED ORDER — METRONIDAZOLE 500 MG PO TABS: 500.0000 mg | ORAL_TABLET | Freq: Three times a day (TID) | ORAL | 0 refills | Status: AC

## 2020-03-13 MED ORDER — CIPROFLOXACIN HCL 500 MG PO TABS: 500.0000 mg | ORAL_TABLET | Freq: Two times a day (BID) | ORAL | 0 refills | Status: AC

## 2020-03-13 MED ORDER — METRONIDAZOLE 500 MG PO TABS
500.0000 mg | ORAL_TABLET | Freq: Once | ORAL | Status: AC
  Administered 2020-03-13: 500 mg via ORAL
  Filled 2020-03-13: qty 1

## 2020-03-13 NOTE — ED Notes (Signed)
This RN agrees with triage assessment. Pt denies numbness or tingling. Pt is A&OX4. Pedal pulses present.

## 2020-03-13 NOTE — ED Notes (Signed)
D/C and new RX discussed with pt, pt verbalized understanding. VSS. NAD noted

## 2020-03-13 NOTE — ED Provider Notes (Signed)
[facility] Emergency Department Provider Note   ____________________________________________   Event Date/Time   First MD Initiated Contact with Patient 03/13/20 1531     (approximate)  I have reviewed the triage vital signs and the nursing notes.   HISTORY  Chief Complaint Abdominal Pain    HPI Anne Sharp is a 75 y.o. female with a stated past medical history of hypertension, vertigo, and diverticulitis who presents for left lower quadrant abdominal pain that has been present for the last 10 days.  Patient states that this pain was worse at the beginning at 10/10, pulling, left lower quadrant abdominal pain that radiated into the left lower quadrant and was partially relieved with passing gas or bowel movements.  Patient states that this pain has been incrementally decreasing over this time but still not gone away.  Patient states that she has had similar pain in the past but states that it is somewhat different from previous diagnoses of diverticulitis that she has had.  Patient currently describes 5/10, nonradiating, left lower quadrant, pulling pain that radiates to the right lower quadrant occasionally.  Patient has tried laxatives, Prilosec, and Alka-Seltzer for the symptoms with minimal relief.  Patient currently denies any vision changes, tinnitus, difficulty speaking, facial droop, sore throat, chest pain, shortness of breath, nausea/vomiting/diarrhea, dysuria, or weakness/numbness/paresthesias in any extremity         Past Medical History:  Diagnosis Date  . Anginal pain (HCC)   . Arthritis   . Bronchitis   . Cat allergies   . Hypertension   . Pain    JAW      POSSIBLE TMJ   RECENT   . Pneumonia   . Seasonal allergies    NASAL CONGESTION/TEARY EYES  . Shortness of breath dyspnea   . Vertigo     Patient Active Problem List   Diagnosis Date Noted  . Diverticulitis of large intestine with abscess 04/23/2016  . Dehydration 04/23/2016  .  Diarrhea 04/23/2016  . Hyperglycemia 04/23/2016  . Diverticulitis of large intestine with perforation and abscess 04/23/2016    Past Surgical History:  Procedure Laterality Date  . ABDOMINAL HYSTERECTOMY    . CARPAL TUNNEL RELEASE Right   . CATARACT EXTRACTION W/PHACO Left 06/02/2015   Procedure: CATARACT EXTRACTION PHACO AND INTRAOCULAR LENS PLACEMENT (IOC);  Surgeon: Sallee Lange, MD;  Location: [facility] ORS;  Service: Ophthalmology;  Laterality: Left;  [place] 01:21 AP% 22.9 CDE 32.25 fluid pack lot # [id number]  . CATARACT EXTRACTION W/PHACO Right 07/30/2015   Procedure: CATARACT EXTRACTION PHACO AND INTRAOCULAR LENS PLACEMENT (IOC);  Surgeon: Sallee Lange, MD;  Location: [facility] ORS;  Service: Ophthalmology;  Laterality: Right;  [place] 01:03 AP% 23.7 CDE 27.14 fluid pack lot # [id number]  . COLONOSCOPY WITH PROPOFOL N/A 08/31/2016   Procedure: COLONOSCOPY WITH PROPOFOL;  Surgeon: Christena Deem, MD;  Location: [facility] ENDOSCOPY;  Service: Endoscopy;  Laterality: N/A;  . EYE SURGERY      Prior to Admission medications   Medication Sig Start Date End Date Taking? Authorizing Provider  ciprofloxacin (CIPRO) 500 MG tablet Take 1 tablet (500 mg total) by mouth 2 (two) times daily for 5 days. 03/13/20 03/18/20 Yes Merwyn Katos, MD  metroNIDAZOLE (FLAGYL) 500 MG tablet Take 1 tablet (500 mg total) by mouth 3 (three) times daily for 5 days. 03/13/20 03/18/20 Yes Merwyn Katos, MD  aspirin EC 81 MG tablet Take 81 mg by mouth daily.    [provider]  cefdinir (OMNICEF) 300  MG capsule Take 300 mg by mouth 2 (two) times daily.    [provider]  fluticasone (FLONASE) 50 MCG/ACT nasal spray Place 1 spray into both nostrils daily.    [provider]  hydrochlorothiazide (MICROZIDE) 12.5 MG capsule Take 12.5 mg by mouth daily.    [provider]  loratadine (CLARITIN) 10 MG tablet Take 10 mg by mouth daily.     [provider]  lovastatin (MEVACOR) 20 MG  tablet Take 20 mg by mouth at bedtime.    [provider]    Allergies Patient has no known allergies.  Family History  Problem Relation Age of Onset  . Breast cancer Neg Hx     Social History Social History   Tobacco Use  . Smoking status: Former Games developer  . Smokeless tobacco: Never Used  Vaping Use  . Vaping Use: Never used  Substance Use Topics  . Alcohol use: Yes    Comment: occasionally  . Drug use: No    Review of Systems Constitutional: No fever/chills Eyes: No visual changes. ENT: No sore throat. Cardiovascular: Denies chest pain. Respiratory: Denies shortness of breath. Gastrointestinal: Endorses abdominal pain.  No nausea, no vomiting.  No diarrhea. Genitourinary: Negative for dysuria. Musculoskeletal: Negative for acute arthralgias Skin: Negative for rash. Neurological: Negative for headaches, weakness/numbness/paresthesias in any extremity Psychiatric: Negative for suicidal ideation/homicidal ideation   ____________________________________________   PHYSICAL EXAM:  VITAL SIGNS: ED Triage Vitals  Enc Vitals Group     BP 03/13/20 1507 (!) 148/89     Pulse Rate 03/13/20 1507 78     Resp 03/13/20 1507 20     Temp 03/13/20 1507 99 F (37.2 C)     Temp Source 03/13/20 1507 Oral     SpO2 03/13/20 1507 98 %     Weight 03/13/20 1519 231 lb (104.8 kg)     Height 03/13/20 1519 5\' 4"  (1.626 m)     Head Circumference --      Peak Flow --      Pain Score 03/13/20 1518 5     Pain Loc --      Pain Edu? --      Excl. in [place]? --    Constitutional: Alert and oriented. Well appearing and in no acute distress. Eyes: Conjunctivae are normal. PERRL. Head: Atraumatic. Nose: No congestion/rhinnorhea. Mouth/Throat: Mucous membranes are moist. Neck: No stridor Cardiovascular: Grossly normal heart sounds.  Good peripheral circulation. Respiratory: Normal respiratory effort.  No retractions. Gastrointestinal: Mild tenderness palpation in the left lower  quadrant.  Soft. No distention. Musculoskeletal: No obvious deformities Neurologic:  Normal speech and language. No gross focal neurologic deficits are appreciated. Skin:  Skin is warm and dry. No rash noted. Psychiatric: Mood and affect are normal. Speech and behavior are normal.  ____________________________________________   LABS (all labs ordered are listed, but only abnormal results are displayed)  Labs Reviewed  COMPREHENSIVE METABOLIC PANEL - Abnormal; Notable for the following components:      Result Value   Glucose, Bld 112 (*)    All other components within normal limits  CBC - Abnormal; Notable for the following components:   MCH 25.3 (*)    All other components within normal limits  LIPASE, BLOOD  URINALYSIS, COMPLETE (UACMP) WITH MICROSCOPIC  TROPONIN I (HIGH SENSITIVITY)  TROPONIN I (HIGH SENSITIVITY)   ____________________________________________  EKG  ED ECG REPORT I, 03/15/20, the attending physician, personally viewed and interpreted this ECG.  Date: 03/13/2020 EKG Time: 1513  Rate: 82 Rhythm: normal sinus rhythm QRS Axis: normal Intervals: normal ST/T Wave abnormalities: normal Narrative Interpretation: no evidence of acute ischemia  ____________________________________________  RADIOLOGY  ED MD interpretation: CT of the abdomen pelvis without contrast shows chronically appearing bowel wall thickening in the mid to distal colon.  Official radiology report(s): CT Abdomen Pelvis W Contrast  Result Date: 03/13/2020 CLINICAL DATA:  Left lower quadrant pain EXAM: CT ABDOMEN AND PELVIS WITH CONTRAST TECHNIQUE: Multidetector CT imaging of the abdomen and pelvis was performed using the standard protocol following bolus administration of intravenous contrast. CONTRAST:  100mL OMNIPAQUE IOHEXOL 300 MG/ML  SOLN COMPARISON:  August 31, 2017 FINDINGS: Lower chest: Bibasilar bronchiectatic change is noted suspect peripheral mucous plugging in the lateral left  base. There is associated bibasilar atelectasis. Hepatobiliary: No focal liver lesions are appreciable. The gallbladder wall is not appreciably thickened. There is no biliary duct dilatation. Pancreas: There is no pancreatic mass or inflammatory focus. Spleen: No splenic lesions are evident. Adrenals/Urinary Tract: Adrenals bilaterally appear normal. There is a cyst arising in the posterior aspect of the upper pole of the left kidney measuring 2.3 x 2.3 cm. There is no appreciable hydronephrosis on either side. There is no evident renal or ureteral calculus on either side. Urinary bladder is midline with wall thickness within normal limits. Stomach/Bowel: There are again noted multiple sigmoid diverticula. Largest diverticulum measures 3.2 x 2.6 cm. There is chronic wall thickening in the mid to distal sigmoid colon, likely due to muscular hypertrophy from chronic diverticulosis. There is no fluid or soft tissue stranding in the region of the sigmoid colon. No other areas of bowel wall thickening or evident. There is no evident bowel obstruction. The terminal ileum appears normal. There is no evident free air or portal venous air. Vascular/Lymphatic: There is no abdominal aortic aneurysm. There is aortic and iliac artery atherosclerotic calcification bilaterally. Major venous structures appear patent. There is no evident adenopathy in the abdomen or pelvis. Reproductive: Uterus absent.  No adnexal region masses. Other: Appendix appears normal. No evident abscess or ascites in the abdomen or pelvis. Fat is present in the umbilicus, a stable appearance. Musculoskeletal: Degenerative changes noted in the lumbar spine. There are no blastic or lytic bone lesions. No intramuscular lesions are evident. IMPRESSION: 1. Fairly extensive sigmoid diverticulosis is again noted. Wall thickening in portions of the mid to distal sigmoid colon likely due to muscular hypertrophy from chronic diverticulosis. Appearance essentially  stable compared to 2019 study. It should be noted that earliest changes of diverticulitis may be difficult to discern from the current changes. There is no fluid or soft tissue stranding appreciable in this region. 2. No evident bowel obstruction. No abscess in the abdomen or pelvis. Appendix appears normal. 3. No renal or ureteral calculus. No hydronephrosis. Urinary bladder wall thickness is normal. 4. Persistent atelectasis and bronchiectasis in the lung bases with probable peripheral mucous plugging laterally on the left. 5.  Uterus absent. 6.  Aortic Atherosclerosis (ICD10-I70.0). Electronically Signed   By: Bretta BangWilliam  Woodruff III M.D.   On: 03/13/2020 16:55    ____________________________________________   PROCEDURES  Procedure(s) performed (including Critical Care):  .1-3 Lead EKG Interpretation Performed by: Merwyn KatosBradler, Kameshia Madruga K, MD Authorized by: Merwyn KatosBradler, [place] Sylvan K, MD     Interpretation: normal     ECG rate:  76   ECG rate assessment: normal     Rhythm: sinus rhythm     Ectopy: none     Conduction: normal  ____________________________________________   INITIAL IMPRESSION / ASSESSMENT AND PLAN / ED COURSE  As part of my medical decision making, I reviewed the following data within the electronic MEDICAL RECORD NUMBER Nursing notes reviewed and incorporated, Labs reviewed, EKG interpreted, Old chart reviewed, Radiograph reviewed and Notes from prior ED visits reviewed and incorporated        Patient has diverticulitis that is amenable to oral antibiotics. Patient has no peritoneal signs or signs of perforation. Patients symptoms not typical for other emergent causes of abdominal pain such as, but not limited to, appendicitis, abdominal aortic aneurysm, surgical biliary disease, acute coronary syndrome, etc.  Patient will be discharged with strict return precautions and follow up with primary MD within 12-24 hours for further evaluation.  Patient understands that they may require  admission and IV antibiotics and possibly surgery if they do not improve with oral antibiotics.      ____________________________________________   FINAL CLINICAL IMPRESSION(S) / ED DIAGNOSES  Final diagnoses:  Left lower quadrant abdominal pain     ED Discharge Orders         Ordered    ciprofloxacin (CIPRO) 500 MG tablet  2 times daily        03/13/20 1717    metroNIDAZOLE (FLAGYL) 500 MG tablet  3 times daily        03/13/20 1717           Note:  This document was prepared using Dragon voice recognition software and may include unintentional dictation errors.   Merwyn Katos, MD 03/13/20 [id number]

## 2020-03-13 NOTE — ED Notes (Signed)
Pt states she gave urine to [facility] clinic and cannot give UA at this time, MD aware.

## 2020-03-13 NOTE — ED Triage Notes (Addendum)
Pt here from [facility] clinic.  Pt reports x10 days started expericing "gas pain", in her chest and left lower quadrant/ groin area. Pain in LLQ was intermittent, has become progressively less painful but is still there. Pain in L chest is intense, non-radiating, not associated with shortness of breath. No urinary symptoms.   Pt reports taking a laxative d/t change in bowel movements, was able to have a BM and felt better. "gas pains" came back. Pain is relieved by walking. Last bowel movement today 03/13/20. States it was small and not like usual.   Hx of the same. Hx of diverticulitis.

## 2020-05-26 ENCOUNTER — Other Ambulatory Visit: Admission: RE | Admit: 2020-05-26 | Payer: BLUE CROSS/BLUE SHIELD | Source: Ambulatory Visit

## 2020-05-28 ENCOUNTER — Ambulatory Visit: Admit: 2020-05-28 | Payer: Medicare Other | Admitting: Internal Medicine

## 2020-05-28 SURGERY — COLONOSCOPY WITH PROPOFOL
Anesthesia: General

## 2020-07-11 ENCOUNTER — Ambulatory Visit: Payer: Medicare Other | Attending: Internal Medicine

## 2020-07-11 ENCOUNTER — Other Ambulatory Visit: Payer: Self-pay

## 2020-07-11 DIAGNOSIS — Z23 Encounter for immunization: Secondary | ICD-10-CM

## 2020-07-11 MED ORDER — MODERNA COVID-19 VACCINE 100 MCG/0.5ML IM SUSP
INTRAMUSCULAR | 0 refills | Status: AC
  Filled 2020-07-11: qty 0.25, 1d supply, fill #0

## 2020-07-11 NOTE — Progress Notes (Signed)
   Covid-19 Vaccination Clinic  Name:  Mady Oubre    MRN: [id number] DOB: [birth year]  07/11/2020  Ms. Marquess was observed post Covid-19 immunization for 15 minutes without incident. She was provided with Vaccine Information Sheet and instruction to access the V-Safe system.   Ms. Seward was instructed to call 911 with any severe reactions post vaccine: Difficulty breathing  Swelling of face and throat  A fast heartbeat  A bad rash all over body  Dizziness and weakness   Immunizations Administered     Name Date Dose VIS Date Route   Moderna Covid-19 Booster Vaccine 07/11/2020  1:54 PM 0.25 mL 11/14/2019 Intramuscular   Manufacturer: Gala Murdoch   Lot: [id number]   NDC: 92330-076-22      Drusilla Kanner, PharmD, MBA Clinical Acute Care Pharmacist

## 2021-03-18 ENCOUNTER — Other Ambulatory Visit: Payer: Self-pay | Admitting: Family Medicine

## 2021-03-18 DIAGNOSIS — Z1231 Encounter for screening mammogram for malignant neoplasm of breast: Secondary | ICD-10-CM

## 2021-04-24 ENCOUNTER — Ambulatory Visit
Admission: RE | Admit: 2021-04-24 | Discharge: 2021-04-24 | Disposition: A | Discharge status: Home / Self Care | Payer: Medicare Other | Source: Ambulatory Visit | Attending: Family Medicine | Admitting: Family Medicine

## 2021-04-24 DIAGNOSIS — Z1231 Encounter for screening mammogram for malignant neoplasm of breast: Secondary | ICD-10-CM | POA: Diagnosis present

## 2022-04-02 ENCOUNTER — Encounter: Payer: Self-pay | Admitting: Intensive Care

## 2022-04-02 ENCOUNTER — Emergency Department
Admission: EM | Admit: 2022-04-02 | Discharge: 2022-04-02 | Disposition: A | Discharge status: Home / Self Care | Payer: Medicare Other | Attending: Emergency Medicine | Admitting: Emergency Medicine

## 2022-04-02 ENCOUNTER — Other Ambulatory Visit: Payer: Self-pay

## 2022-04-02 ENCOUNTER — Emergency Department: Payer: Medicare Other

## 2022-04-02 DIAGNOSIS — R1031 Right lower quadrant pain: Secondary | ICD-10-CM | POA: Diagnosis present

## 2022-04-02 DIAGNOSIS — K5732 Diverticulitis of large intestine without perforation or abscess without bleeding: Secondary | ICD-10-CM | POA: Diagnosis not present

## 2022-04-02 DIAGNOSIS — K5792 Diverticulitis of intestine, part unspecified, without perforation or abscess without bleeding: Secondary | ICD-10-CM

## 2022-04-02 DIAGNOSIS — R109 Unspecified abdominal pain: Secondary | ICD-10-CM

## 2022-04-02 HISTORY — DX: Pure hypercholesterolemia, unspecified: E78.00

## 2022-04-02 LAB — COMPREHENSIVE METABOLIC PANEL
ALT: 22 U/L (ref 0–44)
AST: 21 U/L (ref 15–41)
Albumin: 3.9 g/dL (ref 3.5–5.0)
Alkaline Phosphatase: 63 U/L (ref 38–126)
Anion gap: 11 (ref 5–15)
BUN: 17 mg/dL (ref 8–23)
CO2: 27 mmol/L (ref 22–32)
Calcium: 9.3 mg/dL (ref 8.9–10.3)
Chloride: 101 mmol/L (ref 98–111)
Creatinine, Ser: 0.86 mg/dL (ref 0.44–1.00)
GFR, Estimated: >60 mL/min (ref >60)
Glucose, Bld: 155 mg/dL — ABNORMAL HIGH (ref 70–99)
Potassium: 3.7 mmol/L (ref 3.5–5.1)
Sodium: 139 mmol/L (ref 135–145)
Total Bilirubin: 0.6 mg/dL (ref 0.3–1.2)
Total Protein: 8.2 g/dL — ABNORMAL HIGH (ref 6.5–8.1)

## 2022-04-02 LAB — URINALYSIS, ROUTINE W REFLEX MICROSCOPIC
Bilirubin Urine: NEGATIVE
Glucose, UA: NEGATIVE mg/dL
Hgb urine dipstick: NEGATIVE
Ketones, ur: NEGATIVE mg/dL
Leukocytes,Ua: NEGATIVE
Nitrite: NEGATIVE
Protein, ur: NEGATIVE mg/dL
Specific Gravity, Urine: 1.005 (ref 1.005–1.030)
pH: 5 (ref 5.0–8.0)

## 2022-04-02 LAB — CBC
HCT: 42.9 % (ref 36.0–46.0)
Hemoglobin: 13.1 g/dL (ref 12.0–15.0)
MCH: 26.3 pg (ref 26.0–34.0)
MCHC: 30.5 g/dL (ref 30.0–36.0)
MCV: 86 fL (ref 80.0–100.0)
Platelets: 280 10*3/uL (ref 150–400)
RBC: 4.99 MIL/uL (ref 3.87–5.11)
RDW: 14.6 % (ref 11.5–15.5)
WBC: 6.8 10*3/uL (ref 4.0–10.5)
nRBC: 0 % (ref 0.0–0.2)

## 2022-04-02 LAB — LIPASE, BLOOD: Lipase: 35 U/L (ref 11–51)

## 2022-04-02 MED ORDER — IOHEXOL 350 MG/ML SOLN
100.0000 mL | Freq: Once | INTRAVENOUS | Status: AC | PRN
  Administered 2022-04-02: 100 mL via INTRAVENOUS

## 2022-04-02 MED ORDER — METRONIDAZOLE 500 MG PO TABS: 500.0000 mg | ORAL_TABLET | Freq: Three times a day (TID) | ORAL | 0 refills | Status: AC

## 2022-04-02 MED ORDER — KETOROLAC TROMETHAMINE 15 MG/ML IJ SOLN: 15.0000 mg | Freq: Once | INTRAMUSCULAR | Status: DC

## 2022-04-02 MED ORDER — CIPROFLOXACIN HCL 500 MG PO TABS: 500.0000 mg | ORAL_TABLET | Freq: Two times a day (BID) | ORAL | 0 refills | Status: AC

## 2022-04-02 NOTE — ED Triage Notes (Addendum)
Patient c/o intermittent lower abdominal pain that started Monday. It has gradually gotten worse. History diverticulitis. 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

## 2022-04-02 NOTE — ED Notes (Signed)
First nurse note: Pt here from [facility] clinic with RLQ pain that is radiating to L now. Pain has been ongoing for 1 week . Pt ambulatory with steady gait to triage.

## 2022-04-02 NOTE — Discharge Instructions (Addendum)
Liquid diet and bowel rest.  We do not see any obvious signs for an infection such as diverticulitis only your known diverticulosis but given your continued symptoms we discussed the pros and cons of the antibiotics.  They may cause some diarrhea but if there is some diverticulitis has not been seen on the CT imaging that it could help.  You should return to the ER if you develop fevers worsening pain or any other concerns otherwise please call your primary care doctor to get a colonoscopy scheduled to rule out a cancer.    IMPRESSION: 1. No acute intracranial process. 2. No intracranial large vessel occlusion. 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 3. No hemodynamically significant stenosis in the neck. 4. Aortic atherosclerosis and emphysema.  IMPRESSION: 1. Wall thickening involving the distal sigmoid colon with adjacent 12 mm lymph node. Possibility of neoplasm is raised, however lymph node has been present dating back to at least 2022, suggesting this is reactive. 2. Colonic diverticulosis without acute inflammation. Mild mural hypertrophy of the sigmoid in the region of multiple diverticula. 3. Hepatic steatosis. 4. Small hiatal hernia.

## 2022-04-02 NOTE — ED Provider Notes (Addendum)
[facility] Provider Note    Event Date/Time   First MD Initiated Contact with Patient 04/02/22 2000     (approximate)   History   Abdominal Pain   HPI  Anne Sharp is a 77 y.o. female with history of diverticulitis who comes in with right lower quadrant pain.  Patient reports a few days of right lower quadrant pain that is now been radiating into the left lower quadrant.  She denies any upper abdominal pain chest pain or new shortness of breath.  She states that she has had diverticulitis in the past and typically goes away without any medications but this time it seems more severe.  I did review records and she did have a small diverticular abscess 5 years ago.  She states that she also did have a headache 2 days ago which is very atypical for her.  She reports having used cocaine but this headache seemed to occur after the cocaine use but she is not exactly sure how many days after.  She reports the headache at this time is very minimal.  She states that it was not severe in onset but it was definitely atypical given she does not typically get headaches.  She reports that she just figured it was related to her sinuses at this point.  She denies any alcohol use or any other concerns Physical Exam   Triage Vital Signs: ED Triage Vitals [04/02/22 1835]  Enc Vitals Group     BP (!) 156/93     Pulse Rate (!) 116     Resp 20     Temp 98.1 F (36.7 C)     Temp Source Oral     SpO2 97 %     Weight 226 lb (102.5 kg)     Height '5\' 4"'$  (1.626 m)     Head Circumference      Peak Flow      Pain Score 8     Pain Loc      Pain Edu?      Excl. in [place]?     Most recent vital signs: Vitals:   04/02/22 2000 04/02/22 2045  BP: (!) 153/104   Pulse: (!) 107 87  Resp:    Temp:    SpO2: 94% 96%     General: Awake, no distress.  CV:  Good peripheral perfusion.  Resp:  Normal effort.  Abd:  No distention.  Slightly tender in the lower abdomen. Other:  Cranial  nerves appear intact equal strength in arms and legs   ED Results / Procedures / Treatments   Labs (all labs ordered are listed, but only abnormal results are displayed) Labs Reviewed  COMPREHENSIVE METABOLIC PANEL - Abnormal; Notable for the following components:      Result Value   Glucose, Bld 155 (*)    Total Protein 8.2 (*)    All other components within normal limits  URINALYSIS, ROUTINE W REFLEX MICROSCOPIC - Abnormal; Notable for the following components:   Color, Urine STRAW (*)    APPearance CLEAR (*)    All other components within normal limits  LIPASE, BLOOD  CBC     RADIOLOGY I have reviewed the CT head personally interpreted no evidence of intracranial hemorrhage  PROCEDURES:  Critical Care performed: No  Procedures   MEDICATIONS ORDERED IN ED: Medications  ketorolac (TORADOL) 15 MG/ML injection 15 mg (has no administration in time range)  iohexol (OMNIPAQUE) 350 MG/ML injection 100 mL (100 mLs Intravenous  Contrast Given 04/02/22 2113)     IMPRESSION / MDM / ASSESSMENT AND PLAN / ED COURSE  I reviewed the triage vital signs and the nursing notes.   Patient's presentation is most consistent with acute presentation with potential threat to life or bodily function.   Patient initially tachycardic in triage but upon recheck I evaluate the patient and she is slightly hypertensive but pulse rates are in the 80s.  CT imaging ordered evaluate for any diverticulitis, appendicitis.  Did order a CTA to make sure no aneurysm bleed given cocaine use and the headache is hard to tell if this was a severe headache or not.  Patient declined needing anything for pain at this time.  Cbc normal Lipase normal CMP normal UA negative  IMPRESSION: 1. Wall thickening involving the distal sigmoid colon with adjacent 12 mm lymph node. Possibility of neoplasm is raised, however lymph node has been present dating back to at least 2022, suggesting this is reactive. 2. Colonic  diverticulosis without acute inflammation. Mild mural hypertrophy of the sigmoid in the region of multiple diverticula. 3. Hepatic steatosis. 4. Small hiatal hernia.  IMPRESSION: 1. No acute intracranial process. 2. No intracranial large vessel occlusion. 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 3. No hemodynamically significant stenosis in the neck. 4. Aortic atherosclerosis and emphysema.   Aortic Atherosclerosis (ICD10-I70.0) and Emphysema (ICD10-J43.9).    Reevaluated patient and she is continuing to have some increasing pain now requesting something for pain.  Although then she is also requesting me to take out her IV.  Will give some Toradol.  She denies any rectal bleeding.  We discussed the incidental findings on the CT imaging and the concern for possible neoplasm.  Patient understands that she needs to follow-up with outpatient colonoscopy.  We discussed patient's history of diverticulitis and she reports that this feels exactly like when she has had diverticulitis in the past.  We discussed pros and cons of antibiotics but she would like to proceed given her symptoms have been ongoing.  Did explain to her that we do not see any obvious diverticulitis on the CT scan but given her history and extensive diverticulosis she would like to trial some antibiotics to see if this helps.  She understands that she needs to follow-up with GI.  I discussed the provisional nature of ED diagnosis, the treatment so far, the ongoing plan of care, follow up appointments and return precautions with the patient and any family or support people present. They expressed understanding and agreed with the plan, discharged home.         FINAL CLINICAL IMPRESSION(S) / ED DIAGNOSES   Final diagnoses:  Abdominal pain, unspecified abdominal location  Diverticulitis     Rx / DC Orders   ED Discharge Orders          Ordered    ciprofloxacin (CIPRO) 500 MG tablet  2 times daily        04/02/22 2245     metroNIDAZOLE (FLAGYL) 500 MG tablet  3 times daily        04/02/22 2245             Note:  This document was prepared using Dragon voice recognition software and may include unintentional dictation errors.   Vanessa [place], MD 04/02/22 2251    Vanessa [place], MD 04/02/22 2252

## 2022-07-08 ENCOUNTER — Encounter: Payer: Self-pay | Admitting: Gastroenterology

## 2022-07-14 ENCOUNTER — Encounter: Payer: Self-pay | Admitting: Gastroenterology

## 2022-07-14 NOTE — H&P (Signed)
Pre-Procedure H&P   Patient ID: Anne Sharp is a 77 y.o. female.  Gastroenterology Provider: Jaynie Collins, DO  Referring Provider: Tawni Pummel, PA PCP: Marisue Ivan, MD  Date: 07/15/2022  HPI Anne Sharp is a 77 y.o. female who presents today for Colonoscopy for diverticulitis follow-up.  Patient was seen in the emergency department in March for lower abdominal pain.  She was empirically treated for diverticulitis and noted improvements. CT at that time demonstrated wall thickening at the sigmoid consistent with hypertrophy with a reactive lymph node that has been present for some time.  She last underwent colonoscopy in August 2018 demonstrating hyperplastic polyps and transverse and left-sided diverticulosis.  Prep was limited per report  During her office visit in March she noted "hotness" in the suprapubic and genital area which was her main complaint.  She notes every other day bowel movement with straining.  No melena or hematochezia.  This pain and "hotness" improved with antibiotics.  She was also treated for diverticulitis in 2022. Status post hysterectomy No family history of colon cancer or colon polyps   Past Medical History:  Diagnosis Date   ANA positive    Anginal pain (HCC)    Aortic atherosclerosis (HCC)    Arthritis    Bronchitis    Cat allergies    Elevated rheumatoid factor    High cholesterol    Hypertension    Mild obesity    Pain    JAW      POSSIBLE TMJ   RECENT    Pneumonia    Seasonal allergies    NASAL CONGESTION/TEARY EYES   Shortness of breath dyspnea    Sleep apnea    Vertigo     Past Surgical History:  Procedure Laterality Date   ABDOMINAL HYSTERECTOMY     CARPAL TUNNEL RELEASE Right    CATARACT EXTRACTION W/PHACO Left 06/02/2015   Procedure: CATARACT EXTRACTION PHACO AND INTRAOCULAR LENS PLACEMENT (IOC);  Surgeon: Sallee Lange, MD;  Location: [facility] ORS;  Service: Ophthalmology;  Laterality: Left;  [place]  01:21 AP% 22.9 CDE 32.25 fluid pack lot # [id number]   CATARACT EXTRACTION W/PHACO Right 07/30/2015   Procedure: CATARACT EXTRACTION PHACO AND INTRAOCULAR LENS PLACEMENT (IOC);  Surgeon: Sallee Lange, MD;  Location: [facility] ORS;  Service: Ophthalmology;  Laterality: Right;  [place] 01:03 AP% 23.7 CDE 27.14 fluid pack lot # [id number]   COLONOSCOPY WITH PROPOFOL N/A 08/31/2016   Procedure: COLONOSCOPY WITH PROPOFOL;  Surgeon: Christena Deem, MD;  Location: [facility] ENDOSCOPY;  Service: Endoscopy;  Laterality: N/A;   EYE SURGERY      Family History No h/o GI disease or malignancy  Review of Systems  Constitutional:  Negative for activity change, appetite change, chills, diaphoresis, fatigue, fever and unexpected weight change.  HENT:  Negative for trouble swallowing and voice change.   Respiratory:  Negative for shortness of breath and wheezing.   Cardiovascular:  Negative for chest pain, palpitations and leg swelling.  Gastrointestinal:  Positive for abdominal pain and constipation. Negative for abdominal distention, anal bleeding, blood in stool, diarrhea, nausea, rectal pain and vomiting.  Musculoskeletal:  Negative for arthralgias and myalgias.  Skin:  Negative for color change and pallor.  Neurological:  Negative for dizziness, syncope and weakness.  Psychiatric/Behavioral:  Negative for confusion.   All other systems reviewed and are negative.    Medications No current facility-administered medications on file prior to encounter.   Current Outpatient Medications on File Prior to Encounter  Medication Sig Dispense  Refill   amLODipine (NORVASC) 2.5 MG tablet Take 5 mg by mouth daily.     aspirin EC 81 MG tablet Take 81 mg by mouth daily.     hydrochlorothiazide (HYDRODIURIL) 25 MG tablet Take 25 mg by mouth daily.     lovastatin (MEVACOR) 10 MG tablet Take 40 mg by mouth at bedtime.     cefdinir (OMNICEF) 300 MG capsule Take 300 mg by mouth 2 (two) times daily.     COVID-19 mRNA  vaccine, Moderna, (MODERNA COVID-19 VACCINE) 100 MCG/0.5ML injection Inject into the muscle. 0.25 mL 0   fluticasone (FLONASE) 50 MCG/ACT nasal spray Place 1 spray into both nostrils daily.     hydrochlorothiazide (MICROZIDE) 12.5 MG capsule Take 12.5 mg by mouth daily.     loratadine (CLARITIN) 10 MG tablet Take 10 mg by mouth daily.      lovastatin (MEVACOR) 20 MG tablet Take 20 mg by mouth at bedtime.      Pertinent medications related to GI and procedure were reviewed by me with the patient prior to the procedure   Current Facility-Administered Medications:    0.9 %  sodium chloride infusion, , Intravenous, Continuous, Jaynie Collins, DO  sodium chloride         Allergies  Allergen Reactions   Lisinopril Swelling    Tongue swelling   Allergies were reviewed by me prior to the procedure  Objective   Body mass index is 39.31 kg/m. Vitals:   07/15/22 0747  BP: (!) 126/90  Pulse: 87  Resp: 18  Temp: 98.6 F (37 C)  TempSrc: Temporal  SpO2: 97%  Weight: 103.9 kg  Height: 5\' 4"  (1.626 m)     Physical Exam Vitals and nursing note reviewed.  Constitutional:      General: She is not in acute distress.    Appearance: Normal appearance. She is obese. She is not ill-appearing, toxic-appearing or diaphoretic.  HENT:     Head: Normocephalic and atraumatic.     Nose: Nose normal.     Mouth/Throat:     Mouth: Mucous membranes are moist.     Pharynx: Oropharynx is clear.  Eyes:     General: No scleral icterus.    Extraocular Movements: Extraocular movements intact.  Cardiovascular:     Rate and Rhythm: Normal rate and regular rhythm.     Heart sounds: Normal heart sounds. No murmur heard.    No friction rub. No gallop.  Pulmonary:     Effort: Pulmonary effort is normal. No respiratory distress.     Breath sounds: Normal breath sounds. No wheezing, rhonchi or rales.  Abdominal:     General: Bowel sounds are normal. There is no distension.     Palpations:  Abdomen is soft.     Tenderness: There is no abdominal tenderness. There is no guarding or rebound.  Musculoskeletal:     Cervical back: Neck supple.     Right lower leg: No edema.     Left lower leg: No edema.  Skin:    General: Skin is warm and dry.     Coloration: Skin is not jaundiced or pale.  Neurological:     General: No focal deficit present.     Mental Status: She is alert and oriented to person, place, and time. Mental status is at baseline.  Psychiatric:        Mood and Affect: Mood normal.        Behavior: Behavior normal.  Thought Content: Thought content normal.        Judgment: Judgment normal.      Assessment:  Anne Sharp is a 77 y.o. female  who presents today for Colonoscopy for diverticulitis follow-up.  Plan:  Colonoscopy with possible intervention today  Colonoscopy with possible biopsy, control of bleeding, polypectomy, and interventions as necessary has been discussed with the patient/patient representative. Informed consent was obtained from the patient/patient representative after explaining the indication, nature, and risks of the procedure including but not limited to death, bleeding, perforation, missed neoplasm/lesions, cardiorespiratory compromise, and reaction to medications. Opportunity for questions was given and appropriate answers were provided. Patient/patient representative has verbalized understanding is amenable to undergoing the procedure.   Jaynie Collins, DO  [facility]  Portions of the record may have been created with voice recognition software. Occasional wrong-word or 'sound-a-like' substitutions may have occurred due to the inherent limitations of voice recognition software.  Read the chart carefully and recognize, using context, where substitutions may have occurred.

## 2022-07-15 ENCOUNTER — Ambulatory Visit: Payer: Medicare Other | Admitting: Certified Registered"

## 2022-07-15 ENCOUNTER — Encounter: Payer: Self-pay | Admitting: Gastroenterology

## 2022-07-15 ENCOUNTER — Ambulatory Visit
Admission: RE | Admit: 2022-07-15 | Discharge: 2022-07-15 | Disposition: A | Discharge status: Home / Self Care | Payer: Medicare Other | Attending: Gastroenterology | Admitting: Gastroenterology

## 2022-07-15 ENCOUNTER — Encounter
Admission: RE | Disposition: A | Discharge status: Home / Self Care | Payer: Self-pay | Source: Home / Self Care | Attending: Gastroenterology

## 2022-07-15 DIAGNOSIS — K5732 Diverticulitis of large intestine without perforation or abscess without bleeding: Secondary | ICD-10-CM | POA: Insufficient documentation

## 2022-07-15 DIAGNOSIS — Z79899 Other long term (current) drug therapy: Secondary | ICD-10-CM | POA: Diagnosis not present

## 2022-07-15 DIAGNOSIS — Z09 Encounter for follow-up examination after completed treatment for conditions other than malignant neoplasm: Secondary | ICD-10-CM | POA: Insufficient documentation

## 2022-07-15 DIAGNOSIS — K5730 Diverticulosis of large intestine without perforation or abscess without bleeding: Secondary | ICD-10-CM | POA: Diagnosis not present

## 2022-07-15 DIAGNOSIS — I10 Essential (primary) hypertension: Secondary | ICD-10-CM | POA: Insufficient documentation

## 2022-07-15 DIAGNOSIS — Z90710 Acquired absence of both cervix and uterus: Secondary | ICD-10-CM | POA: Diagnosis not present

## 2022-07-15 DIAGNOSIS — K6389 Other specified diseases of intestine: Secondary | ICD-10-CM | POA: Diagnosis not present

## 2022-07-15 DIAGNOSIS — Z87891 Personal history of nicotine dependence: Secondary | ICD-10-CM | POA: Diagnosis not present

## 2022-07-15 DIAGNOSIS — Z6839 Body mass index (BMI) 39.0-39.9, adult: Secondary | ICD-10-CM | POA: Diagnosis not present

## 2022-07-15 HISTORY — DX: Atherosclerosis of aorta: I70.0

## 2022-07-15 HISTORY — DX: Obesity, unspecified: E66.9

## 2022-07-15 HISTORY — PX: COLONOSCOPY WITH PROPOFOL: SHX5780

## 2022-07-15 HISTORY — DX: Other specified abnormal immunological findings in serum: R76.8

## 2022-07-15 HISTORY — DX: Sleep apnea, unspecified: G47.30

## 2022-07-15 SURGERY — COLONOSCOPY WITH PROPOFOL
Anesthesia: General

## 2022-07-15 MED ORDER — PROPOFOL 500 MG/50ML IV EMUL
INTRAVENOUS | Status: DC | PRN
  Administered 2022-07-15: 150 ug/kg/min via INTRAVENOUS

## 2022-07-15 MED ORDER — LIDOCAINE HCL (CARDIAC) PF 100 MG/5ML IV SOSY
PREFILLED_SYRINGE | INTRAVENOUS | Status: DC | PRN
  Administered 2022-07-15: 50 mg via INTRAVENOUS

## 2022-07-15 MED ORDER — PROPOFOL 10 MG/ML IV BOLUS
INTRAVENOUS | Status: DC | PRN
  Administered 2022-07-15: 80 mg via INTRAVENOUS

## 2022-07-15 MED ORDER — SODIUM CHLORIDE 0.9 % IV SOLN: INTRAVENOUS | Status: DC

## 2022-07-15 NOTE — Anesthesia Preprocedure Evaluation (Addendum)
Anesthesia Evaluation  Patient identified by MRN, date of birth, ID band Patient awake    Reviewed: Allergy & Precautions, NPO status , Patient's Chart, lab work & pertinent test results  Airway Mallampati: III  TM Distance: >3 FB Neck ROM: full    Dental  (+) Teeth Intact, Dental Advidsory Given   Pulmonary neg pulmonary ROS, shortness of breath, former smoker   Pulmonary exam normal  + decreased breath sounds      Cardiovascular Exercise Tolerance: Good hypertension, Pt. on medications Normal cardiovascular exam Rhythm:Regular Rate:Normal     Neuro/Psych negative neurological ROS  negative psych ROS   GI/Hepatic negative GI ROS, Neg liver ROS,,,  Endo/Other  negative endocrine ROS  Morbid obesity  Renal/GU negative Renal ROS  negative genitourinary   Musculoskeletal   Abdominal  (+) + obese  Peds negative pediatric ROS (+)  Hematology negative hematology ROS (+)   Anesthesia Other Findings Past Medical History: No date: ANA positive No date: Anginal pain (HCC) No date: Aortic atherosclerosis (HCC) No date: Arthritis No date: Bronchitis No date: Cat allergies No date: Elevated rheumatoid factor No date: High cholesterol No date: Hypertension No date: Mild obesity No date: Pain     Comment:  JAW      POSSIBLE TMJ   RECENT  No date: Pneumonia No date: Seasonal allergies     Comment:  NASAL CONGESTION/TEARY EYES No date: Shortness of breath dyspnea No date: Sleep apnea No date: Vertigo  Past Surgical History: No date: ABDOMINAL HYSTERECTOMY No date: CARPAL TUNNEL RELEASE; Right 06/02/2015: CATARACT EXTRACTION W/PHACO; Left     Comment:  Procedure: CATARACT EXTRACTION PHACO AND INTRAOCULAR               LENS PLACEMENT (IOC);  Surgeon: Sallee Lange, MD;                Location: [facility] ORS;  Service: Ophthalmology;  Laterality:              Left;  [place] 01:21 AP% 22.9 CDE 32.25 fluid pack lot #                [id number] 07/30/2015: CATARACT EXTRACTION W/PHACO; Right     Comment:  Procedure: CATARACT EXTRACTION PHACO AND INTRAOCULAR               LENS PLACEMENT (IOC);  Surgeon: Sallee Lange, MD;                Location: [facility] ORS;  Service: Ophthalmology;  Laterality:              Right;  [place] 01:03 AP% 23.7 CDE 27.14 fluid pack lot #               [id number] 08/31/2016: COLONOSCOPY WITH PROPOFOL; N/A     Comment:  Procedure: COLONOSCOPY WITH PROPOFOL;  Surgeon:               Christena Deem, MD;  Location: [facility] ENDOSCOPY;                Service: Endoscopy;  Laterality: N/A; No date: EYE SURGERY  BMI    Body Mass Index: 39.31 kg/m      Reproductive/Obstetrics negative OB ROS                             Anesthesia Physical Anesthesia Plan  ASA: 3  Anesthesia Plan: General   Post-op Pain Management: Minimal or no  pain anticipated   Induction: Intravenous  PONV Risk Score and Plan: 3 and Propofol infusion, TIVA and Ondansetron  Airway Management Planned: Nasal Cannula  Additional Equipment: None  Intra-op Plan:   Post-operative Plan:   Informed Consent: I have reviewed the patients History and Physical, chart, labs and discussed the procedure including the risks, benefits and alternatives for the proposed anesthesia with the patient or authorized representative who has indicated his/her understanding and acceptance.     Dental advisory given  Plan Discussed with: CRNA and Surgeon  Anesthesia Plan Comments: (Discussed risks of anesthesia with patient, including possibility of difficulty with spontaneous ventilation under anesthesia necessitating airway intervention, PONV, and rare risks such as cardiac or respiratory or neurological events, and allergic reactions. Discussed the role of CRNA in patient's perioperative care. Patient understands.)        Anesthesia Quick Evaluation

## 2022-07-15 NOTE — Interval H&P Note (Signed)
History and Physical Interval Note: Preprocedure H&P from 07/15/22  was reviewed and there was no interval change after seeing and examining the patient.  Written consent was obtained from the patient after discussion of risks, benefits, and alternatives. Patient has consented to proceed with Colonoscopy with possible intervention   07/15/2022 8:09 AM  Anne Sharp  has presented today for surgery, with the diagnosis of 562.11 (ICD-9-CM) - K57.92 (ICD-10-CM) - Diverticulitis.  The various methods of treatment have been discussed with the patient and family. After consideration of risks, benefits and other options for treatment, the patient has consented to  Procedure(s): COLONOSCOPY WITH PROPOFOL (N/A) as a surgical intervention.  The patient's history has been reviewed, patient examined, no change in status, stable for surgery.  I have reviewed the patient's chart and labs.  Questions were answered to the patient's satisfaction.     Jaynie Collins

## 2022-07-15 NOTE — Transfer of Care (Signed)
Immediate Anesthesia Transfer of Care Note  Patient: Anne Sharp  Procedure(s) Performed: COLONOSCOPY WITH PROPOFOL  Patient Location: Endoscopy Unit  Anesthesia Type:General  Level of Consciousness: drowsy  Airway & Oxygen Therapy: Patient Spontanous Breathing  Post-op Assessment: Report given to RN  Post vital signs: stable  Last Vitals:  Vitals Value Taken Time  BP 90/52 07/15/22 0847  Temp    Pulse 80 07/15/22 0848  Resp 18 07/15/22 0848  SpO2 95 % 07/15/22 0848  Vitals shown include unvalidated device data.  Last Pain:  Vitals:   07/15/22 0747  TempSrc: Temporal  PainSc: 0-No pain         Complications: No notable events documented.

## 2022-07-15 NOTE — Op Note (Signed)
[facility] Patient Name: Anne Sharp Procedure Date: 07/15/2022 8:13 AM MRN: [id number] Account #: [id number] Date of Birth: 1945/05/28 Admit Type: Outpatient Age: 77 Room: [facility] ENDO ROOM 1 Gender: Female Note Status: Finalized Instrument Name: Peds Colonoscope [id number] Procedure:             Colonoscopy Indications:           Follow-up of diverticulitis Providers:             Jaynie Collins DO, DO Referring MD:          Marisue Ivan (Referring MD) Medicines:             Monitored Anesthesia Care Complications:         No immediate complications. Estimated blood loss: None. Procedure:             Pre-Anesthesia Assessment:                        - Prior to the procedure, a History and Physical was                         performed, and patient medications and allergies were                         reviewed. The patient is competent. The risks and                         benefits of the procedure and the sedation options and                         risks were discussed with the patient. All questions                         were answered and informed consent was obtained.                         Patient identification and proposed procedure were                         verified by the physician, the nurse, the anesthetist                         and the technician in the endoscopy suite. Mental                         Status Examination: alert and oriented. Airway                         Examination: normal oropharyngeal airway and neck                         mobility. Respiratory Examination: clear to                         auscultation. CV Examination: RRR, no murmurs, no S3                         or S4. Prophylactic Antibiotics: The patient does not  require prophylactic antibiotics. Prior                         Anticoagulants: The patient has taken no anticoagulant                         or antiplatelet  agents. ASA Grade Assessment: III - A                         patient with severe systemic disease. After reviewing                         the risks and benefits, the patient was deemed in                         satisfactory condition to undergo the procedure. The                         anesthesia plan was to use monitored anesthesia care                         (MAC). Immediately prior to administration of                         medications, the patient was re-assessed for adequacy                         to receive sedatives. The heart rate, respiratory                         rate, oxygen saturations, blood pressure, adequacy of                         pulmonary ventilation, and response to care were                         monitored throughout the procedure. The physical                         status of the patient was re-assessed after the                         procedure.                        After obtaining informed consent, the colonoscope was                         passed under direct vision. Throughout the procedure,                         the patient's blood pressure, pulse, and oxygen                         saturations were monitored continuously. The                         Colonoscope was introduced through the anus and  advanced to the the cecum, identified by appendiceal                         orifice and ileocecal valve. The colonoscopy was                         performed without difficulty. The patient tolerated                         the procedure well. The quality of the bowel                         preparation was evaluated using the BBPS [facility] Bowel                         Preparation Scale) with scores of: Right Colon = 2                         (minor amount of residual staining, small fragments of                         stool and/or opaque liquid, but mucosa seen well),                         Transverse Colon = 2 (minor  amount of residual                         staining, small fragments of stool and/or opaque                         liquid, but mucosa seen well) and Left Colon = 2                         (minor amount of residual staining, small fragments of                         stool and/or opaque liquid, but mucosa seen well). The                         total BBPS score equals 6. The quality of the bowel                         preparation was good. The ileocecal valve, appendiceal                         orifice, and rectum were photographed. Findings:      The perianal and digital rectal examinations were normal. Pertinent       negatives include normal sphincter tone.      Multiple small-mouthed diverticula were found in the entire colon. Most       prevalent in the sigmoid/left colon. Estimated blood loss: none.      A segmental area of moderately thickened folds of the mucosa was found       in the sigmoid colon. Estimated blood loss: none.      The exam was otherwise without abnormality on direct and retroflexion       views.  A small amount of solid stool was found in the entire colon, interfering       with visualization. Lavage of the area was performed using a moderate       amount, resulting in clearance with good visualization. Mildly limited       visualization with narrowing of sigmoid and stool present. Impression:            - Diverticulosis in the entire examined colon.                        - Thickened folds of the mucosa in the sigmoid colon.                        - The examination was otherwise normal on direct and                         retroflexion views.                        - Stool in the entire examined colon.                        - No specimens collected. Recommendation:        - Patient has a contact number available for                         emergencies. The signs and symptoms of potential                         delayed complications were discussed with  the patient.                         Return to normal activities tomorrow. Written                         discharge instructions were provided to the patient.                        - Discharge patient to home.                        - Resume previous diet.                        - Continue present medications.                        - No further screening/surveillance colonoscopy given                         advanced age. Previous colonoscopy (2018) negative for                         polyps.                        - Return to GI clinic as previously scheduled.                        - The findings and recommendations were discussed with  the patient. Procedure Code(s):     --- Professional ---                        [id number], Colonoscopy, flexible; diagnostic, including                         collection of specimen(s) by brushing or washing, when                         performed (separate procedure) Diagnosis Code(s):     --- Professional ---                        K63.89, Other specified diseases of intestine                        K57.32, Diverticulitis of large intestine without                         perforation or abscess without bleeding                        K57.30, Diverticulosis of large intestine without                         perforation or abscess without bleeding CPT copyright 2022 American Medical Association. All rights reserved. The codes documented in this report are preliminary and upon coder review may  be revised to meet current compliance requirements. Attending Participation:      I personally performed the entire procedure. Elfredia Nevins, DO Jaynie Collins DO, DO 07/15/2022 8:49:52 AM This report has been signed electronically. Number of Addenda: 0 Note Initiated On: 07/15/2022 8:13 AM Scope Withdrawal Time: 0 hours 11 minutes 32 seconds  Total Procedure Duration: 0 hours 22 minutes 7 seconds  Estimated Blood Loss:  Estimated  blood loss: none.      [facility]

## 2022-07-15 NOTE — Anesthesia Postprocedure Evaluation (Signed)
Anesthesia Post Note  Patient: Anne Sharp  Procedure(s) Performed: COLONOSCOPY WITH PROPOFOL  Patient location during evaluation: Endoscopy Anesthesia Type: General Level of consciousness: awake and alert Pain management: pain level controlled Vital Signs Assessment: post-procedure vital signs reviewed and stable Respiratory status: spontaneous breathing, nonlabored ventilation, respiratory function stable and patient connected to nasal cannula oxygen Cardiovascular status: blood pressure returned to baseline and stable Postop Assessment: no apparent nausea or vomiting Anesthetic complications: no  No notable events documented.   Last Vitals:  Vitals:   07/15/22 0856 07/15/22 0906  BP: 93/63 107/78  Pulse:    Resp:    Temp:    SpO2:      Last Pain:  Vitals:   07/15/22 0906  TempSrc:   PainSc: 0-No pain                 Stephanie Coup

## 2022-07-16 ENCOUNTER — Encounter: Payer: Self-pay | Admitting: Gastroenterology

## 2023-01-19 IMAGING — MG MM DIGITAL SCREENING BILAT W/ TOMO AND CAD
6 of 12 series · 6 of 36 positions shown · non-contrast
Comparison: Previous exam(s).

CLINICAL DATA: Screening.

EXAM:
DIGITAL SCREENING BILATERAL MAMMOGRAM WITH TOMOSYNTHESIS AND CAD
TECHNIQUE: Bilateral screening digital craniocaudal and mediolateral oblique
mammograms were obtained. Bilateral screening digital breast
tomosynthesis was performed. The images were evaluated with
computer-aided detection.

[R MLO synth-2D (1 of 2)]
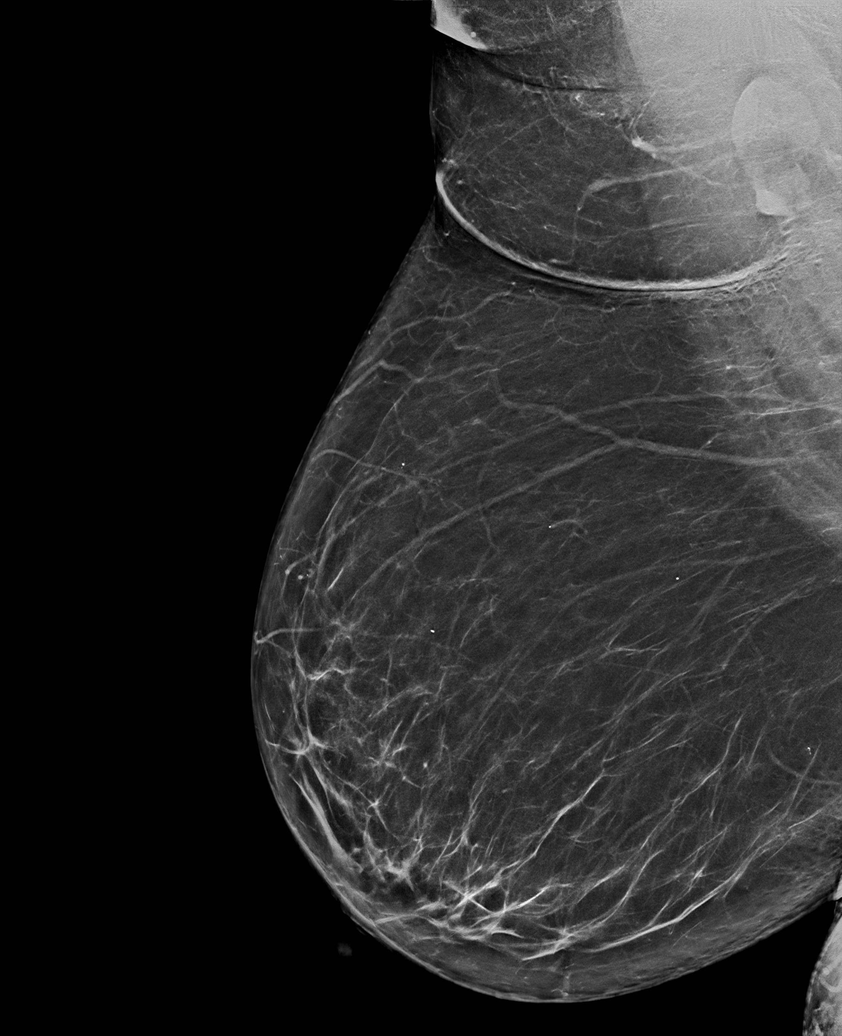

[L CV synth-2D]
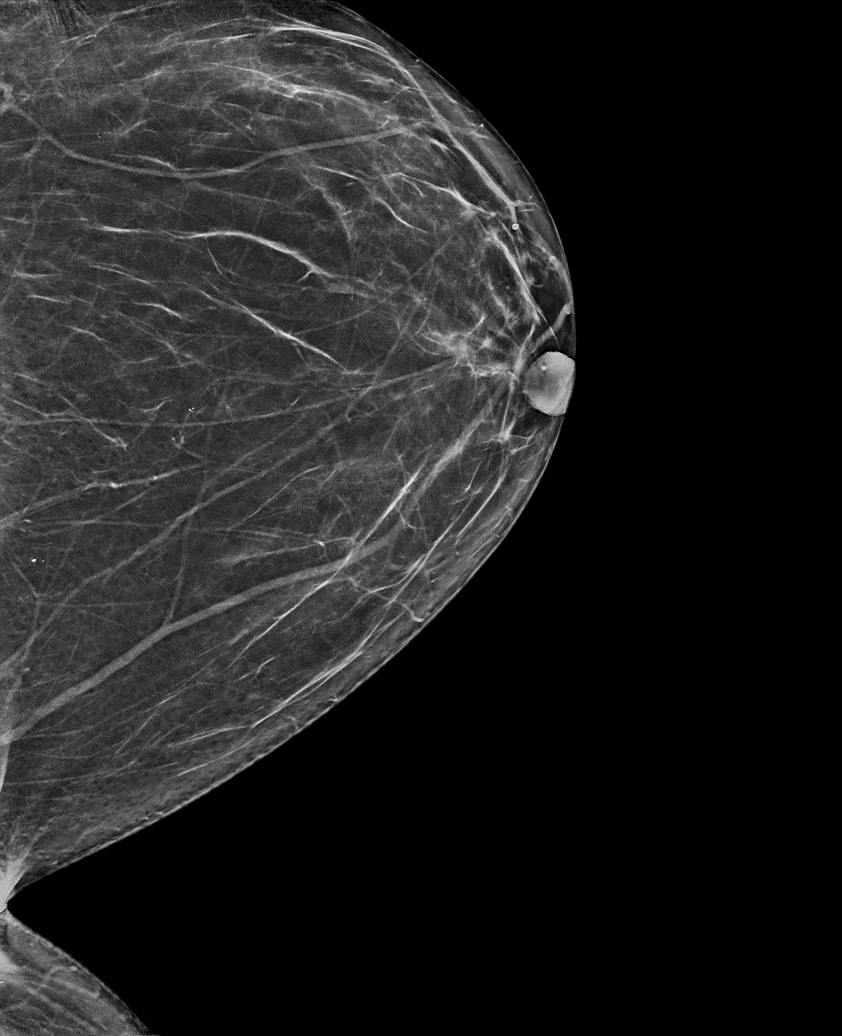

[R CC synth-2D]
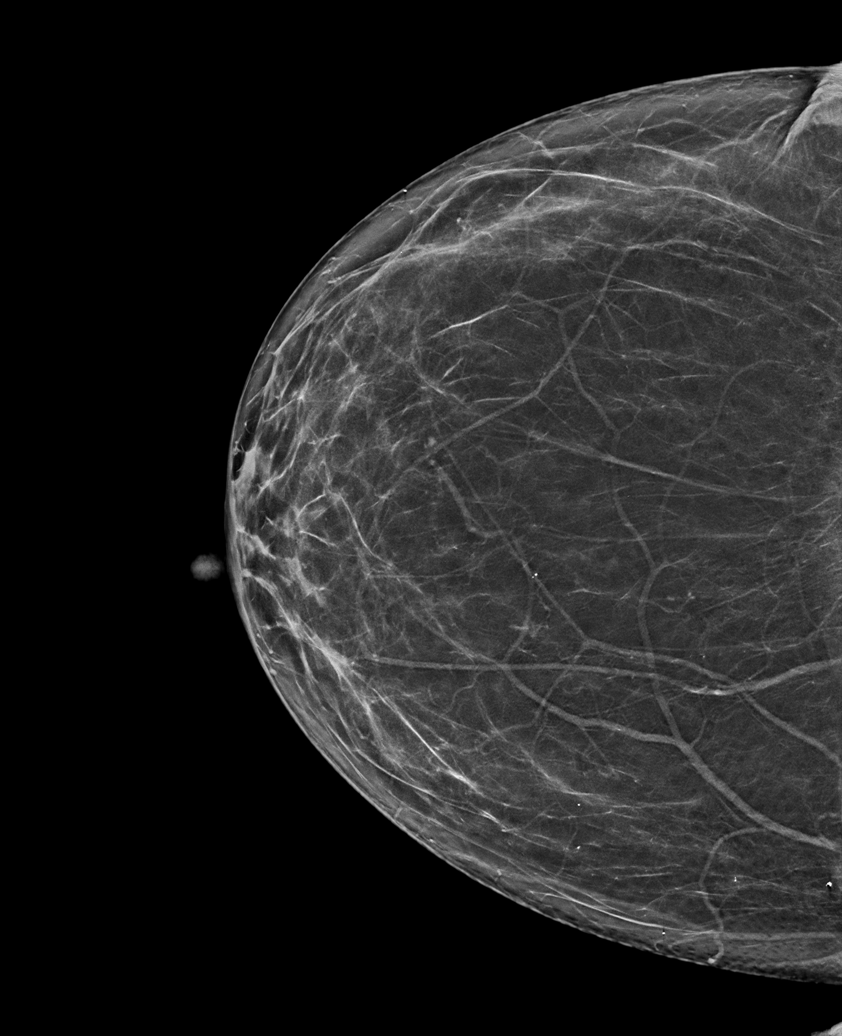

[R MLO synth-2D (2 of 2)]
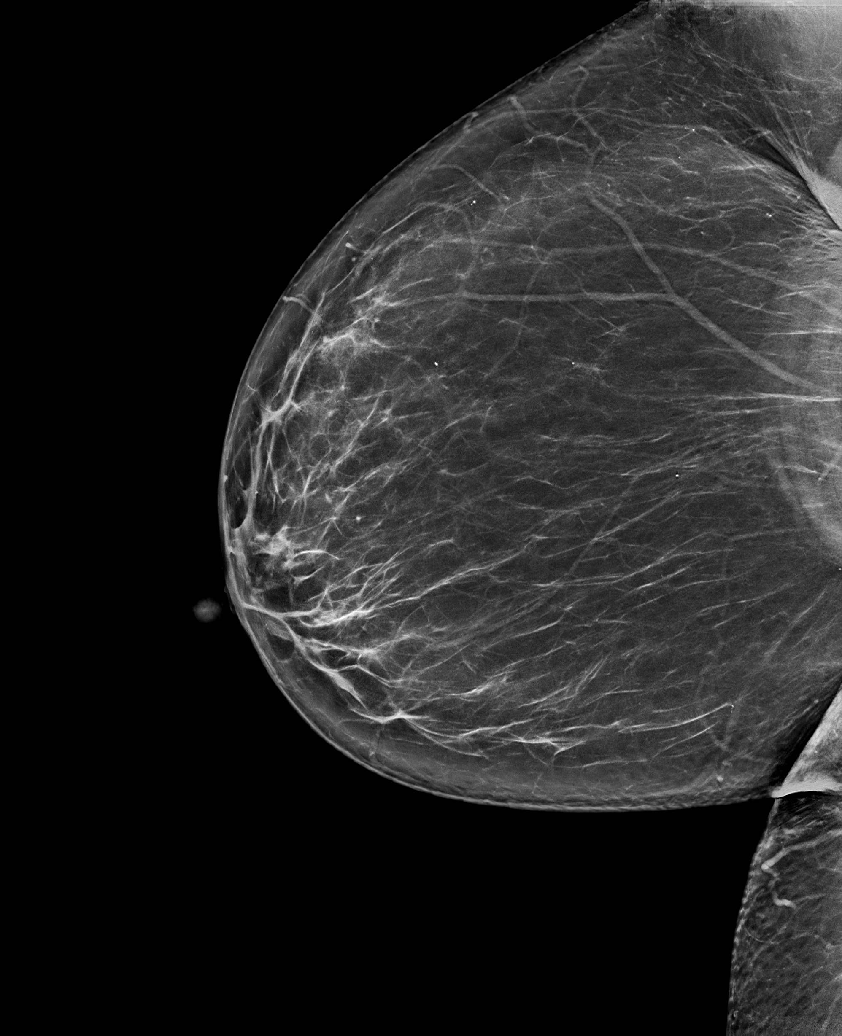

[L MLO synth-2D]
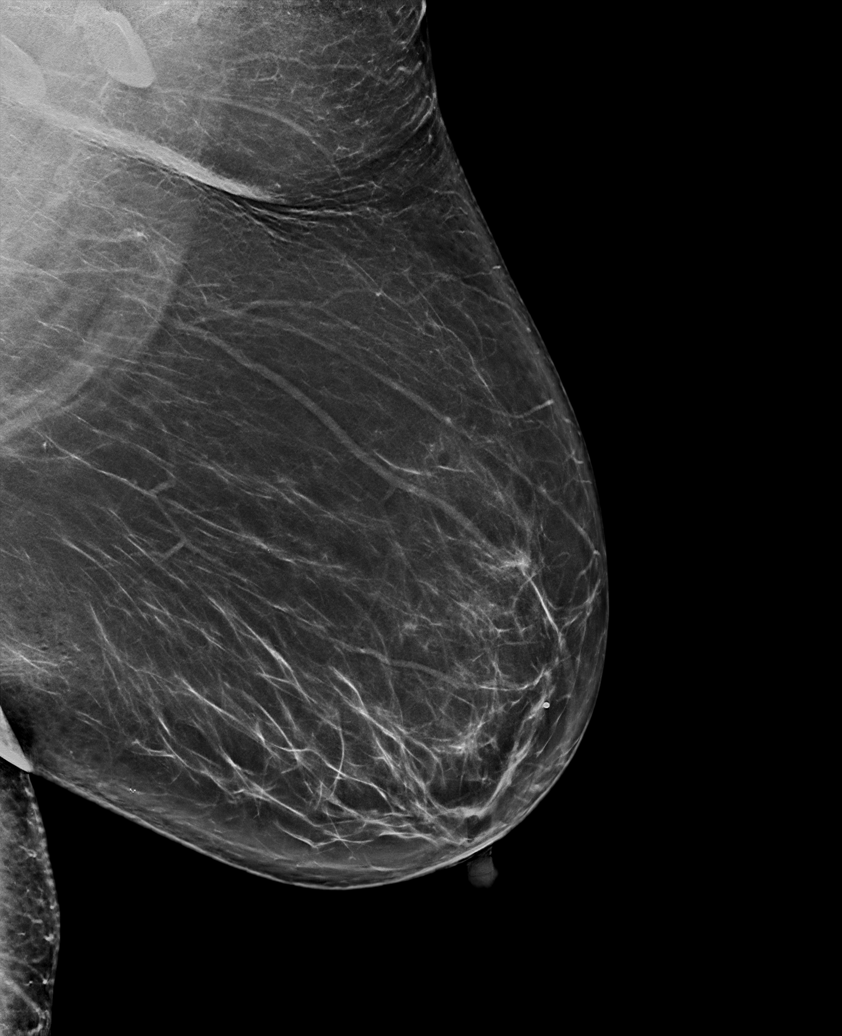

[L CC synth-2D]
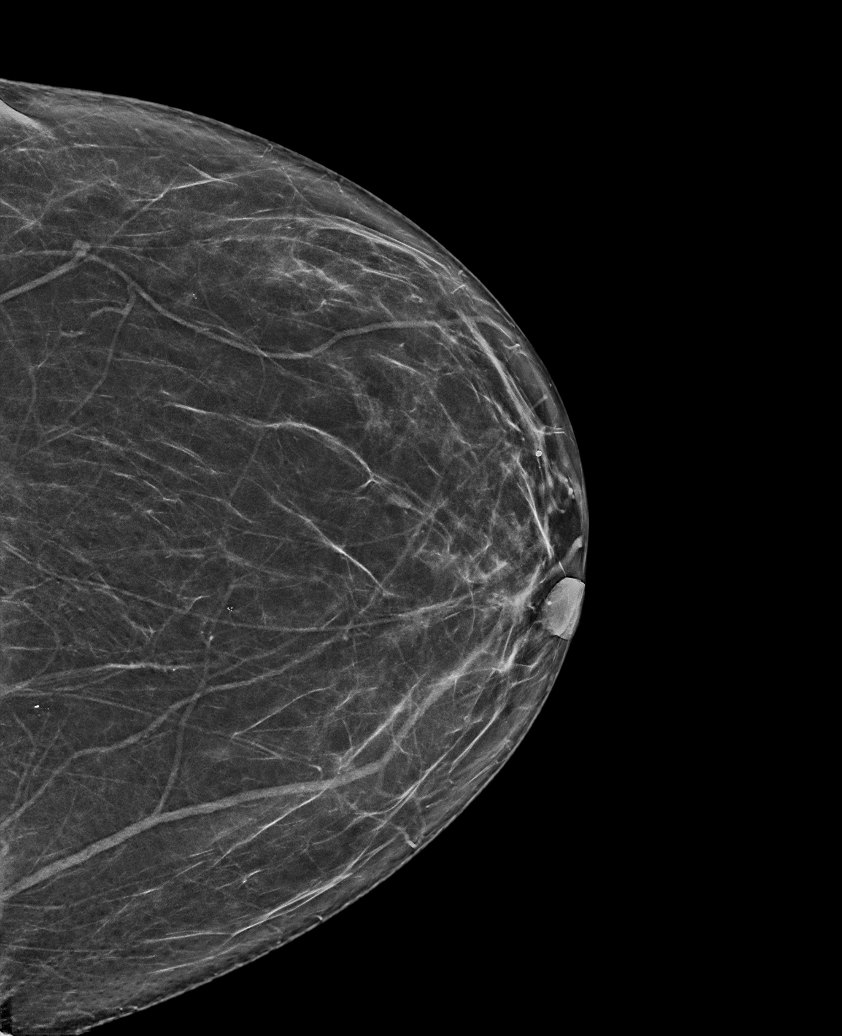

[6 of 36 positions shown; findings below may reference images not displayed]

ACR Breast Density Category b: There are scattered areas of
fibroglandular density.
FINDINGS: There are no findings suspicious for malignancy.
IMPRESSION: No mammographic evidence of malignancy. A result letter of this
screening mammogram will be mailed directly to the patient.

RECOMMENDATION:
Screening mammogram in one year. (Code:51-O-LD2)

BI-RADS CATEGORY  1: Negative.

## 2023-06-22 ENCOUNTER — Other Ambulatory Visit: Payer: Self-pay

## 2023-06-22 ENCOUNTER — Emergency Department: Admission: EM | Admit: 2023-06-22 | Discharge: 2023-06-22 | Disposition: A | Discharge status: Home / Self Care

## 2023-06-22 ENCOUNTER — Emergency Department

## 2023-06-22 DIAGNOSIS — D72829 Elevated white blood cell count, unspecified: Secondary | ICD-10-CM | POA: Insufficient documentation

## 2023-06-22 DIAGNOSIS — K625 Hemorrhage of anus and rectum: Secondary | ICD-10-CM

## 2023-06-22 DIAGNOSIS — K5792 Diverticulitis of intestine, part unspecified, without perforation or abscess without bleeding: Secondary | ICD-10-CM | POA: Diagnosis not present

## 2023-06-22 DIAGNOSIS — R1032 Left lower quadrant pain: Secondary | ICD-10-CM | POA: Diagnosis present

## 2023-06-22 DIAGNOSIS — I10 Essential (primary) hypertension: Secondary | ICD-10-CM | POA: Diagnosis not present

## 2023-06-22 LAB — COMPREHENSIVE METABOLIC PANEL WITH GFR
ALT: 20 U/L (ref 0–44)
AST: 18 U/L (ref 15–41)
Albumin: 3.9 g/dL (ref 3.5–5.0)
Alkaline Phosphatase: 49 U/L (ref 38–126)
Anion gap: 12 (ref 5–15)
BUN: 20 mg/dL (ref 8–23)
CO2: 26 mmol/L (ref 22–32)
Calcium: 9.6 mg/dL (ref 8.9–10.3)
Chloride: 101 mmol/L (ref 98–111)
Creatinine, Ser: 0.9 mg/dL (ref 0.44–1.00)
GFR, Estimated: >60 mL/min (ref >60)
Glucose, Bld: 98 mg/dL (ref 70–99)
Potassium: 3.5 mmol/L (ref 3.5–5.1)
Sodium: 139 mmol/L (ref 135–145)
Total Bilirubin: 0.5 mg/dL (ref 0.0–1.2)
Total Protein: 8.3 g/dL — ABNORMAL HIGH (ref 6.5–8.1)

## 2023-06-22 LAB — CBC
HCT: 42.7 % (ref 36.0–46.0)
Hemoglobin: 13.5 g/dL (ref 12.0–15.0)
MCH: 26.8 pg (ref 26.0–34.0)
MCHC: 31.6 g/dL (ref 30.0–36.0)
MCV: 84.9 fL (ref 80.0–100.0)
Platelets: 386 10*3/uL (ref 150–400)
RBC: 5.03 MIL/uL (ref 3.87–5.11)
RDW: 15 % (ref 11.5–15.5)
WBC: 15 10*3/uL — ABNORMAL HIGH (ref 4.0–10.5)
nRBC: 0 % (ref 0.0–0.2)

## 2023-06-22 LAB — TYPE AND SCREEN
ABO/RH(D): A POS
Antibody Screen: NEGATIVE

## 2023-06-22 MED ORDER — IOHEXOL 350 MG/ML SOLN
100.0000 mL | Freq: Once | INTRAVENOUS | Status: AC | PRN
  Administered 2023-06-22: 100 mL via INTRAVENOUS

## 2023-06-22 MED ORDER — AMOXICILLIN-POT CLAVULANATE 875-125 MG PO TABS
1.0000 | ORAL_TABLET | Freq: Once | ORAL | Status: AC
  Administered 2023-06-22: 1 via ORAL
  Filled 2023-06-22: qty 1

## 2023-06-22 MED ORDER — AMOXICILLIN-POT CLAVULANATE 875-125 MG PO TABS: 1.0000 | ORAL_TABLET | Freq: Two times a day (BID) | ORAL | 0 refills | Status: AC

## 2023-06-22 MED ORDER — POLYETHYLENE GLYCOL 3350 17 G PO PACK: 17.0000 g | PACK | Freq: Every day | ORAL | 0 refills | Status: AC

## 2023-06-22 NOTE — ED Triage Notes (Signed)
 Pt to ED via POV from home. Pt reports had 3 bowel movements today all had dark red blood. Pt reports abd discomfort. Pt reports hx diverticulosis.

## 2023-06-22 NOTE — Discharge Instructions (Addendum)
 Evaluation in the emergency department was reassuring, and your blood level was normal today.  I started you on a stool softener and recommended follow-up with your primary care provider as well as your gastroenterologist for reevaluation.  Continue to monitor your symptoms, and return to the emergency department with any new or worsening symptoms.

## 2023-06-22 NOTE — ED Notes (Signed)
 Iv team at bedside

## 2023-06-22 NOTE — ED Provider Notes (Signed)
 [facility] Provider Note    Event Date/Time   First MD Initiated Contact with Patient 06/22/23 1338     (approximate)   History   Rectal Bleeding  Pt to ED via POV from home. Pt reports had 3 bowel movements today all had dark red blood. Pt reports abd discomfort. Pt reports hx diverticulosis.    HPI Jesscia Imm is a 78 y.o. female PMH diverticulitis, hypertension presents for evaluation of bloody stool - Patient has had 3 episodes of dark red bloody stool today.  No chest pain, shortness of breath, lightheadedness.  Not on blood thinners. - Does note mild left lower quadrant abdominal discomfort as well.  She has recently been constipated. - Remote history hysterectomy, no other abdominal surgical history  Colonoscopy 07/15/22: Impression:            - Diverticulosis in the entire examined colon.                        - Thickened folds of the mucosa in the sigmoid colon.                        - The examination was otherwise normal on direct and                         retroflexion views.                        - Stool in the entire examined colon.                        - No specimens collected.      Physical Exam   Triage Vital Signs: ED Triage Vitals  Encounter Vitals Group     BP 06/22/23 1237 (!) 169/102     Systolic BP Percentile --      Diastolic BP Percentile --      Pulse Rate 06/22/23 1237 (!) 118     Resp 06/22/23 1237 20     Temp 06/22/23 1237 98 F (36.7 C)     Temp Source 06/22/23 1237 Oral     SpO2 06/22/23 1237 98 %     Weight --      Height --      Head Circumference --      Peak Flow --      Pain Score 06/22/23 1238 2     Pain Loc --      Pain Education --      Exclude from Growth Chart --     Most recent vital signs: Vitals:   06/22/23 1237 06/22/23 1400  BP: (!) 169/102 132/81  Pulse: (!) 118 83  Resp: 20 15  Temp: 98 F (36.7 C)   SpO2: 98% 99%     General: Awake, no distress.  CV:  Good peripheral  perfusion. RRR, RP 2+ (heart rate 80s at time of my eval) Resp:  Normal effort. Abd:  No distention.  Very mild left lower quadrant tenderness only Rectal:  No obvious hemorrhoids appreciated, small amount of dark red blood in rectal vault, no active bleeding appreciated    ED Results / Procedures / Treatments   Labs (all labs ordered are listed, but only abnormal results are displayed) Labs Reviewed  COMPREHENSIVE METABOLIC PANEL WITH GFR - Abnormal; Notable for the following components:  Result Value   Total Protein 8.3 (*)    All other components within normal limits  CBC - Abnormal; Notable for the following components:   WBC 15.0 (*)    All other components within normal limits  POC OCCULT BLOOD, ED  TYPE AND SCREEN     EKG  N/a   RADIOLOGY Pending  PROCEDURES:  Critical Care performed: No  Procedures   MEDICATIONS ORDERED IN ED: Medications - No data to display   IMPRESSION / MDM / ASSESSMENT AND PLAN / ED COURSE  I reviewed the triage vital signs and the nursing notes.                              DDX/MDM/AP: Differential diagnosis includes, but is not limited to, diverticulitis, consider internal hemorrhoidal bleeding, doubt ischemic bowel and is very well-appearing patient with minimal discomfort, doubt appendicitis.  Do not clinically suspect brisk upper GI bleed.  Plan: - Labs - CTA abdomen pelvis GI bleed protocol - N.p.o.   Patient's presentation is most consistent with acute presentation with potential threat to life or bodily function.   ED course below.  Leukocytosis, hemoglobin stable, CMP unremarkable, BUN normal.  Signed out to oncoming ED provider pending results of CTA GI bleed protocol.  If unremarkable, will discharge home on stool regimen and plan to follow-up.  Gastroenterologist with whom patient is already established.  Clinical Course as of 06/22/23 1431  Wed Jun 22, 2023  1401 Cmp wnl Cbc w/ leukocytosis, hgb higher  than prior [MM]    Clinical Course User Index [MM] Collis Deaner, MD     FINAL CLINICAL IMPRESSION(S) / ED DIAGNOSES   Final diagnoses:  Rectal bleeding  LLQ pain     Rx / DC Orders   ED Discharge Orders          Ordered    polyethylene glycol (MIRALAX ) 17 g packet  Daily        06/22/23 1430             Note:  This document was prepared using Dragon voice recognition software and may include unintentional dictation errors.   Collis Deaner, MD 06/22/23 [phone]

## 2023-06-22 NOTE — ED Provider Notes (Signed)
-----------------------------------------   3:13 PM on 06/22/2023 -----------------------------------------  Blood pressure 132/81, pulse 83, temperature 98 F (36.7 C), temperature source Oral, resp. rate 15, SpO2 99%.  Assuming care from Dr. Cam Cava.  In short, Anne Sharp is a 78 y.o. female with a chief complaint of Rectal Bleeding .  Refer to the original H&P for additional details.  The current plan of care is to follow-up CTA abdomen/pelvis for LLQ abdominal pain with rectal bleeding.  ----------------------------------------- 5:55 PM on 06/22/2023 ----------------------------------------- CTA is positive for diverticulitis, no other acute findings noted and no evidence of active extravasation noted.  Patient offered admission to the hospital for management of diverticulitis and rectal bleeding, but declines a prefers to follow-up as an outpatient.  This is reasonable given stable hemoglobin and vital signs.  She was counseled to follow-up with her PCP and to return to the ED for new or worsening symptoms, referral also given to GI.  Patient agrees with plan.    Twilla Galea, MD 06/22/23 [phone]

## 2023-09-07 ENCOUNTER — Emergency Department

## 2023-09-07 ENCOUNTER — Other Ambulatory Visit: Payer: Self-pay

## 2023-09-07 ENCOUNTER — Emergency Department
Admission: EM | Admit: 2023-09-07 | Discharge: 2023-09-08 | Disposition: A | Discharge status: Home / Self Care | Attending: Emergency Medicine | Admitting: Emergency Medicine

## 2023-09-07 DIAGNOSIS — R1030 Lower abdominal pain, unspecified: Secondary | ICD-10-CM | POA: Diagnosis present

## 2023-09-07 LAB — COMPREHENSIVE METABOLIC PANEL WITH GFR
ALT: 22 U/L (ref 0–44)
AST: 29 U/L (ref 15–41)
Albumin: 3.8 g/dL (ref 3.5–5.0)
Alkaline Phosphatase: 46 U/L (ref 38–126)
Anion gap: 12 (ref 5–15)
BUN: 13 mg/dL (ref 8–23)
CO2: 27 mmol/L (ref 22–32)
Calcium: 9.5 mg/dL (ref 8.9–10.3)
Chloride: 100 mmol/L (ref 98–111)
Creatinine, Ser: 0.88 mg/dL (ref 0.44–1.00)
GFR, Estimated: >60 mL/min (ref >60)
Glucose, Bld: 115 mg/dL — ABNORMAL HIGH (ref 70–99)
Potassium: 3.6 mmol/L (ref 3.5–5.1)
Sodium: 139 mmol/L (ref 135–145)
Total Bilirubin: 0.5 mg/dL (ref 0.0–1.2)
Total Protein: 8.1 g/dL (ref 6.5–8.1)

## 2023-09-07 LAB — URINALYSIS, ROUTINE W REFLEX MICROSCOPIC
Bilirubin Urine: NEGATIVE
Glucose, UA: NEGATIVE mg/dL
Hgb urine dipstick: NEGATIVE
Ketones, ur: NEGATIVE mg/dL
Leukocytes,Ua: NEGATIVE
Nitrite: NEGATIVE
Protein, ur: NEGATIVE mg/dL
Specific Gravity, Urine: >1.046 — ABNORMAL HIGH (ref 1.005–1.030)
pH: 6 (ref 5.0–8.0)

## 2023-09-07 LAB — CBC
HCT: 44 % (ref 36.0–46.0)
Hemoglobin: 13.3 g/dL (ref 12.0–15.0)
MCH: 25 pg — ABNORMAL LOW (ref 26.0–34.0)
MCHC: 30.2 g/dL (ref 30.0–36.0)
MCV: 82.7 fL (ref 80.0–100.0)
Platelets: 347 K/uL (ref 150–400)
RBC: 5.32 MIL/uL — ABNORMAL HIGH (ref 3.87–5.11)
RDW: 16.9 % — ABNORMAL HIGH (ref 11.5–15.5)
WBC: 8.3 K/uL (ref 4.0–10.5)
nRBC: 0 % (ref 0.0–0.2)

## 2023-09-07 LAB — LIPASE, BLOOD: Lipase: 38 U/L (ref 11–51)

## 2023-09-07 MED ORDER — IOHEXOL 300 MG/ML  SOLN
100.0000 mL | Freq: Once | INTRAMUSCULAR | Status: AC | PRN
  Administered 2023-09-07 (×2): 100 mL via INTRAVENOUS

## 2023-09-07 NOTE — ED Triage Notes (Signed)
 Pt reports RLQ abd cramping, pt reports pain is worse with movement and after eating, pt states she is also bloated, pt denies n/v .

## 2023-09-08 DIAGNOSIS — R1030 Lower abdominal pain, unspecified: Secondary | ICD-10-CM | POA: Diagnosis not present

## 2023-09-08 MED ORDER — KETOROLAC TROMETHAMINE 30 MG/ML IJ SOLN
15.0000 mg | Freq: Once | INTRAMUSCULAR | Status: AC
  Administered 2023-09-08: 15 mg via INTRAVENOUS
  Filled 2023-09-08: qty 1

## 2023-09-08 MED ORDER — ACETAMINOPHEN 500 MG PO TABS
1000.0000 mg | ORAL_TABLET | Freq: Once | ORAL | Status: AC
  Administered 2023-09-08: 1000 mg via ORAL
  Filled 2023-09-08: qty 2

## 2023-09-08 NOTE — ED Provider Notes (Signed)
 [facility] Provider Note    Event Date/Time   First MD Initiated Contact with Patient 09/07/23 2345     (approximate)   History   Abdominal Pain   HPI  Anne Sharp is a 78 y.o. female who presents to the ED for evaluation of Abdominal Pain   I review a GI clinic visit from June.  History of diverticulitis.  Patient presents with 2 days of lower abdominal pain that resolved by the time I see her.  She reports after waiting a few hours to get roomed her pain has improved.  No other concerns   Physical Exam   Triage Vital Signs: ED Triage Vitals  Encounter Vitals Group     BP 09/07/23 2058 (!) 148/93     Girls Systolic BP Percentile --      Girls Diastolic BP Percentile --      Boys Systolic BP Percentile --      Boys Diastolic BP Percentile --      Pulse Rate 09/07/23 2058 96     Resp 09/07/23 2058 20     Temp 09/07/23 2105 98.9 F (37.2 C)     Temp Source 09/07/23 2105 Oral     SpO2 09/07/23 2058 100 %     Weight 09/07/23 2058 235 lb (106.6 kg)     Height 09/07/23 2058 5' 4 (1.626 m)     Head Circumference --      Peak Flow --      Pain Score 09/07/23 2058 9     Pain Loc --      Pain Education --      Exclude from Growth Chart --     Most recent vital signs: Vitals:   09/07/23 2105 09/08/23 0000  BP:  131/80  Pulse:  66  Resp:  18  Temp: 98.9 F (37.2 C) 97.9 F (36.6 C)  SpO2:  96%    General: Awake, no distress.  CV:  Good peripheral perfusion.  Resp:  Normal effort.  Abd:  No distention.  Soft and benign MSK:  No deformity noted.  Neuro:  No focal deficits appreciated. Other:     ED Results / Procedures / Treatments   Labs (all labs ordered are listed, but only abnormal results are displayed) Labs Reviewed  COMPREHENSIVE METABOLIC PANEL WITH GFR - Abnormal; Notable for the following components:      Result Value   Glucose, Bld 115 (*)    All other components within normal limits  CBC - Abnormal; Notable for  the following components:   RBC 5.32 (*)    MCH 25.0 (*)    RDW 16.9 (*)    All other components within normal limits  URINALYSIS, ROUTINE W REFLEX MICROSCOPIC - Abnormal; Notable for the following components:   Color, Urine YELLOW (*)    APPearance HAZY (*)    Specific Gravity, Urine >1.046 (*)    All other components within normal limits  LIPASE, BLOOD    EKG   RADIOLOGY CT abdomen/pelvis interpreted by me without evidence of acute pathology.  Official radiology report(s): CT ABDOMEN PELVIS W CONTRAST Result Date: 09/07/2023 CLINICAL DATA:  Right lower quadrant pain EXAM: CT ABDOMEN AND PELVIS WITH CONTRAST TECHNIQUE: Multidetector CT imaging of the abdomen and pelvis was performed using the standard protocol following bolus administration of intravenous contrast. RADIATION DOSE REDUCTION: This exam was performed according to the departmental dose-optimization program which includes automated exposure control, adjustment of the mA and/or kV  according to patient size and/or use of iterative reconstruction technique. CONTRAST:  OMNIPAQUE  IOHEXOL  300 MG/ML  SOLN COMPARISON:  06/22/2023 FINDINGS: Lower chest: Persistent scarring is noted in the left lung base laterally stable from the most recent exam as well as an exam on 04/02/2022. No new focal abnormality is noted. Mild bronchiectasis is seen as well as mild emphysematous changes. Hepatobiliary: No focal liver abnormality is seen. No gallstones, gallbladder wall thickening, or biliary dilatation. Pancreas: Unremarkable. No pancreatic ductal dilatation or surrounding inflammatory changes. Spleen: Normal in size without focal abnormality. Adrenals/Urinary Tract: Adrenal glands are within normal limits. Kidneys demonstrate a normal enhancement pattern bilaterally. Left renal cyst is seen stable from prior exams. No follow-up is recommended. No renal calculi or obstructive changes are seen. The bladder is decompressed. Stomach/Bowel:  Diverticular change of the colon is noted without definitive diverticulitis. There is a large diverticula filled with fecal material stable from the prior exam. The more proximal colon shows scattered fecal material without obstructive change. The appendix is within normal limits. Small bowel and stomach are unremarkable. Vascular/Lymphatic: Aortic atherosclerosis. No enlarged abdominal or pelvic lymph nodes. Reproductive: Status post hysterectomy. No adnexal masses. Other: No abdominal wall hernia or abnormality. No abdominopelvic ascites. Musculoskeletal: No acute or significant osseous findings. IMPRESSION: Diverticulosis without diverticulitis. A prominent fecal filled diverticula is noted in the sigmoid stable from the prior exam. Normal appearing appendix. Stable scarring in the left base. Electronically Signed   By: Oneil Devonshire M.D.   On: 09/07/2023 22:42    PROCEDURES and INTERVENTIONS:  Procedures  Medications  iohexol  (OMNIPAQUE ) 300 MG/ML solution 100 mL (100 mLs Intravenous Contrast Given 09/07/23 2143)  acetaminophen  (TYLENOL ) tablet 1,000 mg (1,000 mg Oral Given 09/08/23 0259)  ketorolac  (TORADOL ) 30 MG/ML injection 15 mg (15 mg Intravenous Given 09/08/23 0300)     IMPRESSION / MDM / ASSESSMENT AND PLAN / ED COURSE  I reviewed the triage vital signs and the nursing notes.  Differential diagnosis includes, but is not limited to, diverticulitis, appendicitis, UTI, pancreatitis, constipation,  {Patient presents with symptoms of an acute illness or injury that is potentially life-threatening.  Patient presents with resolved lower abdominal pain, benign workup and suitable for outpatient management.  Reassuring imaging.  Normal CBC, metabolic panel, UA and lipase.      FINAL CLINICAL IMPRESSION(S) / ED DIAGNOSES   Final diagnoses:  Lower abdominal pain     Rx / DC Orders   ED Discharge Orders     None        Note:  This document was prepared using Dragon voice  recognition software and may include unintentional dictation errors.   Claudene Rover, MD 09/08/23 [phone]

## 2023-09-20 ENCOUNTER — Other Ambulatory Visit: Payer: Self-pay | Admitting: Orthopedic Surgery

## 2023-09-20 DIAGNOSIS — M4807 Spinal stenosis, lumbosacral region: Secondary | ICD-10-CM

## 2023-09-22 ENCOUNTER — Ambulatory Visit
Admission: RE | Admit: 2023-09-22 | Discharge: 2023-09-22 | Disposition: A | Discharge status: Home / Self Care | Source: Ambulatory Visit | Attending: Orthopedic Surgery | Admitting: Orthopedic Surgery

## 2023-09-22 DIAGNOSIS — M4807 Spinal stenosis, lumbosacral region: Secondary | ICD-10-CM | POA: Insufficient documentation
